# Patient Record
Sex: Male | Born: 1960
Health system: Southern US, Community
[De-identification: ages and names within clinical notes are randomized; demographics above are authoritative.]

## PROBLEM LIST (undated history)

## (undated) DIAGNOSIS — J45909 Unspecified asthma, uncomplicated: Secondary | ICD-10-CM

## (undated) DIAGNOSIS — I519 Heart disease, unspecified: Secondary | ICD-10-CM

## (undated) DIAGNOSIS — M1990 Unspecified osteoarthritis, unspecified site: Secondary | ICD-10-CM

## (undated) DIAGNOSIS — K219 Gastro-esophageal reflux disease without esophagitis: Secondary | ICD-10-CM

## (undated) DIAGNOSIS — K635 Polyp of colon: Secondary | ICD-10-CM

## (undated) DIAGNOSIS — B019 Varicella without complication: Secondary | ICD-10-CM

## (undated) DIAGNOSIS — T7840XA Allergy, unspecified, initial encounter: Secondary | ICD-10-CM

## (undated) DIAGNOSIS — E785 Hyperlipidemia, unspecified: Secondary | ICD-10-CM

## (undated) DIAGNOSIS — I10 Essential (primary) hypertension: Secondary | ICD-10-CM

## (undated) HISTORY — PX: ROTATOR CUFF REPAIR: SHX139

## (undated) HISTORY — PX: COLONOSCOPY: SHX174

## (undated) HISTORY — DX: Gastro-esophageal reflux disease without esophagitis: K21.9

## (undated) HISTORY — DX: Unspecified osteoarthritis, unspecified site: M19.90

## (undated) HISTORY — DX: Unspecified asthma, uncomplicated: J45.909

## (undated) HISTORY — DX: Heart disease, unspecified: I51.9

## (undated) HISTORY — PX: SPINE SURGERY: SHX786

## (undated) HISTORY — DX: Allergy, unspecified, initial encounter: T78.40XA

## (undated) HISTORY — DX: Varicella without complication: B01.9

## (undated) HISTORY — PX: WRIST SURGERY: SHX841

## (undated) HISTORY — DX: Essential (primary) hypertension: I10

## (undated) HISTORY — DX: Polyp of colon: K63.5

## (undated) HISTORY — DX: Hyperlipidemia, unspecified: E78.5

---

## 2006-04-25 ENCOUNTER — Ambulatory Visit (HOSPITAL_COMMUNITY): Admission: RE | Admit: 2006-04-25 | Discharge: 2006-04-26 | Payer: Self-pay | Admitting: Specialist

## 2012-03-23 ENCOUNTER — Other Ambulatory Visit: Payer: Self-pay | Admitting: Family Medicine

## 2012-03-23 ENCOUNTER — Ambulatory Visit
Admission: RE | Admit: 2012-03-23 | Discharge: 2012-03-23 | Disposition: A | Discharge status: Home / Self Care | Payer: 59 | Source: Ambulatory Visit | Attending: Family Medicine | Admitting: Family Medicine

## 2012-03-23 DIAGNOSIS — M5410 Radiculopathy, site unspecified: Secondary | ICD-10-CM

## 2013-11-08 ENCOUNTER — Ambulatory Visit: Payer: 59 | Admitting: Internal Medicine

## 2013-11-22 ENCOUNTER — Encounter: Payer: Self-pay | Admitting: Internal Medicine

## 2013-11-22 ENCOUNTER — Ambulatory Visit (INDEPENDENT_AMBULATORY_CARE_PROVIDER_SITE_OTHER): Payer: 59 | Admitting: Internal Medicine

## 2013-11-22 VITALS — BP 122/84 | HR 64 | Temp 97.9°F | Ht 69.33 in | Wt 198.0 lb

## 2013-11-22 DIAGNOSIS — R1032 Left lower quadrant pain: Secondary | ICD-10-CM

## 2013-11-22 DIAGNOSIS — Z1211 Encounter for screening for malignant neoplasm of colon: Secondary | ICD-10-CM

## 2013-11-22 DIAGNOSIS — R1013 Epigastric pain: Secondary | ICD-10-CM

## 2013-11-22 DIAGNOSIS — R1900 Intra-abdominal and pelvic swelling, mass and lump, unspecified site: Secondary | ICD-10-CM

## 2013-11-22 NOTE — Progress Notes (Signed)
Pre visit review using our clinic review tool, if applicable. No additional management support is needed unless otherwise documented below in the visit note. 

## 2013-11-22 NOTE — Patient Instructions (Addendum)
Colonoscopy  A colonoscopy is an exam to look at the entire large intestine (colon). This exam can help find problems such as tumors, polyps, inflammation, and areas of bleeding. The exam takes about 1 hour.   LET YOUR HEALTH CARE PROVIDER KNOW ABOUT:   · Any allergies you have.  · All medicines you are taking, including vitamins, herbs, eye drops, creams, and over-the-counter medicines.  · Previous problems you or members of your family have had with the use of anesthetics.  · Any blood disorders you have.  · Previous surgeries you have had.  · Medical conditions you have.  RISKS AND COMPLICATIONS   Generally, this is a safe procedure. However, as with any procedure, complications can occur. Possible complications include:  · Bleeding.  · Tearing or rupture of the colon wall.  · Reaction to medicines given during the exam.  · Infection (rare).  BEFORE THE PROCEDURE   · Ask your health care provider about changing or stopping your regular medicines.  · You may be prescribed an oral bowel prep. This involves drinking a large amount of medicated liquid, starting the day before your procedure. The liquid will cause you to have multiple loose stools until your stool is almost clear or light green. This cleans out your colon in preparation for the procedure.  · Do not eat or drink anything else once you have started the bowel prep, unless your health care provider tells you it is safe to do so.  · Arrange for someone to drive you home after the procedure.  PROCEDURE   · You will be given medicine to help you relax (sedative).  · You will lie on your side with your knees bent.  · A long, flexible tube with a light and camera on the end (colonoscope) will be inserted through the rectum and into the colon. The camera sends video back to a computer screen as it moves through the colon. The colonoscope also releases carbon dioxide gas to inflate the colon. This helps your health care provider see the area better.  · During  the exam, your health care provider may take a small tissue sample (biopsy) to be examined under a microscope if any abnormalities are found.  · The exam is finished when the entire colon has been viewed.  AFTER THE PROCEDURE   · Do not drive for 24 hours after the exam.  · You may have a small amount of blood in your stool.  · You may pass moderate amounts of gas and have mild abdominal cramping or bloating. This is caused by the gas used to inflate your colon during the exam.  · Ask when your test results will be ready and how you will get your results. Make sure you get your test results.  Document Released: 04/29/2000 Document Revised: 02/20/2013 Document Reviewed: 01/07/2013  [employer]® Patient Information ©2015 [employer]. This information is not intended to replace advice given to you by your health care provider. Make sure you discuss any questions you have with your health care provider.

## 2013-11-22 NOTE — Progress Notes (Signed)
HPI  Pt presents to the clinic today to establish care. He is transferring care from Dr. Rex Kras. He does have some concerns today about some swelling in his left upper abdomen. He noticed this about 3 weeks ago. He denies nausea, bloating, constipation or blood in his stool. The area is not painful. He denies any trauma to the area. He has not tried anything OTC for it.  Flu: 03/2013 Tetanus: more than 10 years ago Colon Screening: 2014 PSA Screening: never Eye Doctor: yearly Dentist: as needed  Past Medical History  Diagnosis Date  . Allergy-induced asthma   . Arthritis   . Chicken pox   . GERD (gastroesophageal reflux disease)   . Allergy   . Heart disease   . Hypertension   . Hyperlipidemia     Current Outpatient Prescriptions  Medication Sig Dispense Refill  . cetirizine (ZYRTEC) 10 MG tablet Take 10 mg by mouth daily.      Marland Kitchen diltiazem (DILTIAZEM CD) 180 MG 24 hr capsule Take 180 mg by mouth daily.      . famotidine (PEPCID) 20 MG tablet Take 20 mg by mouth 2 (two) times daily.      Marland Kitchen GLUCOSAMINE-CHONDROITIN-MSM-D3 PO Take 2 capsules by mouth daily.      . Omega-3 Fatty Acids (FISH OIL) 1200 MG CAPS Take 2 capsules by mouth daily.       No current facility-administered medications for this visit.    Allergies  Allergen Reactions  . Celebrex [Celecoxib] Swelling  . Penicillins     Was a baby when he had reaction--unknown    Family History  Problem Relation Age of Onset  . Arthritis Mother   . Hyperlipidemia Mother   . Hypertension Mother   . Arthritis Father   . COPD Father   . Hypertension Maternal Uncle   . Arthritis Maternal Grandmother   . Heart disease Maternal Grandmother   . Hypertension Maternal Grandmother   . Arthritis Maternal Grandfather   . Arthritis Paternal Grandmother   . Heart disease Paternal Grandmother   . Arthritis Paternal Grandfather     History   Social History  . Marital Status: Married    Spouse Name: N/A    Number of  Children: N/A  . Years of Education: N/A   Occupational History  . Not on file.   Social History Main Topics  . Smoking status: Former Smoker    Types: Cigarettes  . Smokeless tobacco: Never Used     Comment: quit 10 years  . Alcohol Use: Yes     Comment: occasional  . Drug Use: Not on file  . Sexual Activity: Not on file   Other Topics Concern  . Not on file   Social History Narrative  . No narrative on file    ROS:  Constitutional: Denies fever, malaise, fatigue, headache or abrupt weight changes.  Respiratory: Denies difficulty breathing, shortness of breath, cough or sputum production.   Cardiovascular: Denies chest pain, chest tightness, palpitations or swelling in the hands or feet.  Gastrointestinal: Denies abdominal pain, bloating, constipation, diarrhea or blood in the stool.   No other specific complaints in a complete review of systems (except as listed in HPI above).  PE:  BP 122/84  Pulse 64  Temp(Src) 97.9 F (36.6 C) (Oral)  Ht 5' 9.33" (1.761 m)  Wt 198 lb (89.812 kg)  BMI 28.96 kg/m2  SpO2 98% Wt Readings from Last 3 Encounters:  11/22/13 198 lb (89.812 kg)  General: Appears his stated age, well developed, well nourished in NAD. Cardiovascular: Normal rate and rhythm. S1,S2 noted.  No murmur, rubs or gallops noted. No JVD or BLE edema. No carotid bruits noted. Pulmonary/Chest: Normal effort and positive vesicular breath sounds. No respiratory distress. No wheezes, rales or ronchi noted.  Abdomen: Soft and tender in the epigastric and LLQ. Normal bowel sounds, no bruits noted. No distention or masses noted. Liver, spleen and kidneys non palpable.   Assessment and Plan:  LLQ and epigastric pain:  He is overdue for his screening colonoscopy Will refer to GI to have that done He has a know history of reflux controlled on zantac ? Diverticulosis  Left abdominal swelling:  Appears to be subcutaneous fat to me If does not resolve, will  consider further evaluation Lets take care of the abdominal pain at this time.  RTC as needed or if pain persist or worsen

## 2013-12-05 ENCOUNTER — Encounter: Payer: Self-pay | Admitting: Internal Medicine

## 2014-01-28 ENCOUNTER — Ambulatory Visit (AMBULATORY_SURGERY_CENTER): Payer: Self-pay

## 2014-01-28 VITALS — Ht 70.0 in | Wt 201.2 lb

## 2014-01-28 DIAGNOSIS — Z1211 Encounter for screening for malignant neoplasm of colon: Secondary | ICD-10-CM

## 2014-01-28 MED ORDER — MOVIPREP 100 G PO SOLR: ORAL | Status: DC

## 2014-01-28 NOTE — Progress Notes (Signed)
Per pt, no allergies to soy or egg products.Pt not taking any weight loss meds or using  O2 at home. 

## 2014-02-11 ENCOUNTER — Encounter: Payer: Self-pay | Admitting: Internal Medicine

## 2014-02-11 ENCOUNTER — Ambulatory Visit (AMBULATORY_SURGERY_CENTER): Payer: 59 | Admitting: Internal Medicine

## 2014-02-11 VITALS — BP 105/74 | HR 63 | Temp 97.0°F | Resp 16 | Ht 70.0 in | Wt 201.0 lb

## 2014-02-11 DIAGNOSIS — K635 Polyp of colon: Secondary | ICD-10-CM

## 2014-02-11 DIAGNOSIS — D122 Benign neoplasm of ascending colon: Secondary | ICD-10-CM

## 2014-02-11 DIAGNOSIS — D126 Benign neoplasm of colon, unspecified: Secondary | ICD-10-CM

## 2014-02-11 DIAGNOSIS — Z1211 Encounter for screening for malignant neoplasm of colon: Secondary | ICD-10-CM

## 2014-02-11 HISTORY — DX: Polyp of colon: K63.5

## 2014-02-11 MED ORDER — SODIUM CHLORIDE 0.9 % IV SOLN: 500.0000 mL | INTRAVENOUS | Status: DC

## 2014-02-11 NOTE — Op Note (Signed)
[facility]  [address] [place], [id number]   COLONOSCOPY PROCEDURE REPORT  PATIENT: Daniel Zimmerman, Daniel Zimmerman  MR#: [id number] BIRTHDATE: December 08, 1960 , 53  yrs. old GENDER: male ENDOSCOPIST: Eustace Quail, MD REFERRED JF:[id number] Baity, NP. PROCEDURE DATE:  02/11/2014 PROCEDURE:   Colonoscopy with snare polypectomy x 2 First Screening Colonoscopy - Avg.  risk and is 50 yrs.  old or older Yes.  Prior Negative Screening - Now for repeat screening. N/A  History of Adenoma - Now for follow-up colonoscopy & has been > or = to 3 yrs.  N/A  Polyps Removed Today? Yes. ASA CLASS:   Class II INDICATIONS:first colonoscopy and average risk for colorectal cancer. MEDICATIONS: Monitored anesthesia care and Propofol 250 mg IV  DESCRIPTION OF PROCEDURE:   After the risks benefits and alternatives of the procedure were thoroughly explained, informed consent was obtained.  The digital rectal exam revealed no abnormalities of the rectum.   The LB [id number]  endoscope was introduced through the anus and advanced to the cecum, which was identified by both the appendix and ileocecal valve. No adverse events experienced.   The quality of the prep was excellent, using MoviPrep  The instrument was then slowly withdrawn as the colon was fully examined.  COLON FINDINGS: Two sessile polyps, 5 and 5mm in size, were found in the ascending colon.  A polypectomy was performed with a cold snare.  The resection was complete, the polyp tissue was completely retrieved and sent to histology.   There was moderate diverticulosis noted in the sigmoid colon.   The colon mucosa was otherwise normal.  Retroflexed views revealed no abnormalities. The time to cecum=1 minutes 39 seconds.  Withdrawal time=12 minutes 25 seconds.  The scope was withdrawn and the procedure completed. COMPLICATIONS: There were no immediate complications.  ENDOSCOPIC IMPRESSION: 1.   Two sessile polyps were found in the  ascending colon; polypectomy was performed with a cold snare 2.   Moderate diverticulosis was noted in the sigmoid colon 3.   The colon mucosa was otherwise normal  RECOMMENDATIONS: 1. Follow up colonoscopy in 5 years  eSigned:  Eustace Quail, MD 02/11/2014 1:32 PM   cc: The Patient   ; Webb Silversmith, NP

## 2014-02-11 NOTE — Patient Instructions (Addendum)
YOU HAD AN ENDOSCOPIC PROCEDURE TODAY AT THE [facility]: Refer to the procedure report that was given to you for any specific questions about what was found during the examination.  If the procedure report does not answer your questions, please call your gastroenterologist to clarify.  If you requested that your care partner not be given the details of your procedure findings, then the procedure report has been included in a sealed envelope for you to review at your convenience later.  YOU SHOULD EXPECT: Some feelings of bloating in the abdomen. Passage of more gas than usual.  Walking can help get rid of the air that was put into your GI tract during the procedure and reduce the bloating. If you had a lower endoscopy (such as a colonoscopy or flexible sigmoidoscopy) you may notice spotting of blood in your stool or on the toilet paper. If you underwent a bowel prep for your procedure, then you may not have a normal bowel movement for a few days.  DIET: Your first meal following the procedure should be a light meal and then it is ok to progress to your normal diet.  A half-sandwich or bowl of soup is an example of a good first meal.  Heavy or fried foods are harder to digest and may make you feel nauseous or bloated.  Likewise meals heavy in dairy and vegetables can cause extra gas to form and this can also increase the bloating.  Drink plenty of fluids but you should avoid alcoholic beverages for 24 hours.  ACTIVITY: Your care partner should take you home directly after the procedure.  You should plan to take it easy, moving slowly for the rest of the day.  You can resume normal activity the day after the procedure however you should NOT DRIVE or use heavy machinery for 24 hours (because of the sedation medicines used during the test).    SYMPTOMS TO REPORT IMMEDIATELY: A gastroenterologist can be reached at any hour.  During normal business hours, 8:30 AM to 5:00 PM Monday through Friday,  call [phone].  After hours and on weekends, please call the GI answering service at [phone] who will take a message and have the physician on call contact you.   Following lower endoscopy (colonoscopy or flexible sigmoidoscopy):  Excessive amounts of blood in the stool  Significant tenderness or worsening of abdominal pains  Swelling of the abdomen that is new, acute  Fever of 100F or higher FOLLOW UP: If any biopsies were taken you will be contacted by phone or by letter within the next 1-3 weeks.  Call your gastroenterologist if you have not heard about the biopsies in 3 weeks.  Our staff will call the home number listed on your records the next business day following your procedure to check on you and address any questions or concerns that you may have at that time regarding the information given to you following your procedure. This is a courtesy call and so if there is no answer at the home number and we have not heard from you through the emergency physician on call, we will assume that you have returned to your regular daily activities without incident.  SIGNATURES/CONFIDENTIALITY: You and/or your care partner have signed paperwork which will be entered into your electronic medical record.  These signatures attest to the fact that that the information above on your After Visit Summary has been reviewed and is understood.  Full responsibility of the confidentiality of this discharge   information lies with you and/or your care-partner.     Handouts were given to your care partner on polyps, diverticulosis, a high fiber diet with liberal fluid intake. You may resume your current medications today. Await biopsy results. Please call if any questions or concerns.  YOU HAD AN ENDOSCOPIC PROCEDURE TODAY AT [facility]: Refer to the procedure report that was given to you for any specific questions about what was found during the examination.  If the procedure  report does not answer your questions, please call your gastroenterologist to clarify.  If you requested that your care partner not be given the details of your procedure findings, then the procedure report has been included in a sealed envelope for you to review at your convenience later.  YOU SHOULD EXPECT: Some feelings of bloating in the abdomen. Passage of more gas than usual.  Walking can help get rid of the air that was put into your GI tract during the procedure and reduce the bloating. If you had a lower endoscopy (such as a colonoscopy or flexible sigmoidoscopy) you may notice spotting of blood in your stool or on the toilet paper. If you underwent a bowel prep for your procedure, then you may not have a normal bowel movement for a few days.  DIET: Your first meal following the procedure should be a light meal and then it is ok to progress to your normal diet.  A half-sandwich or bowl of soup is an example of a good first meal.  Heavy or fried foods are harder to digest and may make you feel nauseous or bloated.  Likewise meals heavy in dairy and vegetables can cause extra gas to form and this can also increase the bloating.  Drink plenty of fluids but you should avoid alcoholic beverages for 24 hours.  ACTIVITY: Your care partner should take you home directly after the procedure.  You should plan to take it easy, moving slowly for the rest of the day.  You can resume normal activity the day after the procedure however you should NOT DRIVE or use heavy machinery for 24 hours (because of the sedation medicines used during the test).    SYMPTOMS TO REPORT IMMEDIATELY: A gastroenterologist can be reached at any hour.  During normal business hours, 8:30 AM to 5:00 PM Monday through Friday, call [phone].  After hours and on weekends, please call the GI answering service at [phone] who will take a message and have the physician on call contact you.   Following lower endoscopy  (colonoscopy or flexible sigmoidoscopy):  Excessive amounts of blood in the stool  Significant tenderness or worsening of abdominal pains  Swelling of the abdomen that is new, acute  Fever of 100F or higher  FOLLOW UP: If any biopsies were taken you will be contacted by phone or by letter within the next 1-3 weeks.  Call your gastroenterologist if you have not heard about the biopsies in 3 weeks.  Our staff will call the home number listed on your records the next business day following your procedure to check on you and address any questions or concerns that you may have at that time regarding the information given to you following your procedure. This is a courtesy call and so if there is no answer at the home number and we have not heard from you through the emergency physician on call, we will assume that you have returned to your regular daily activities without incident.  SIGNATURES/CONFIDENTIALITY:  You and/or your care partner have signed paperwork which will be entered into your electronic medical record.  These signatures attest to the fact that that the information above on your After Visit Summary has been reviewed and is understood.  Full responsibility of the confidentiality of this discharge information lies with you and/or your care-partner.  Handouts were given to your care partner on diverticulosis, a high fiber diet with liberal fluid intake, and polyps. Await pathology results. You may resume your current medications today. Please call if any questions or concerns.

## 2014-02-11 NOTE — Progress Notes (Signed)
Report to PACU, RN, vss, BBS= Clear.  

## 2014-02-11 NOTE — Progress Notes (Signed)
Called to room to assist during endoscopic procedure.  Patient ID and intended procedure confirmed with present staff. Received instructions for my participation in the procedure from the performing physician.  

## 2014-02-11 NOTE — Progress Notes (Signed)
No problems noted in the recovery room. maw 

## 2014-02-12 ENCOUNTER — Telehealth: Payer: Self-pay

## 2014-02-12 NOTE — Telephone Encounter (Signed)
  Follow up Call-  Call back number 02/11/2014  Post procedure Call Back phone  # [phone]  Permission to leave phone message Yes     Patient questions:  Do you have a fever, pain , or abdominal swelling? No. Pain Score  0 *  Have you tolerated food without any problems? Yes.    Have you been able to return to your normal activities? Yes.    Do you have any questions about your discharge instructions: Diet   No. Medications  No. Follow up visit  No.  Do you have questions or concerns about your Care? No.  Actions: * If pain score is 4 or above: No action needed, pain <4.

## 2014-02-17 ENCOUNTER — Encounter: Payer: Self-pay | Admitting: Internal Medicine

## 2014-07-25 ENCOUNTER — Telehealth: Payer: Self-pay

## 2014-07-25 NOTE — Telephone Encounter (Signed)
Yes, have him make physical appt

## 2014-07-25 NOTE — Telephone Encounter (Signed)
Mrs Hansson left v/m; pt saw cardiologist recently and cardiologist noted that pt has not had recent lipid panel and requested that pt get that scheduled thru PCP. Thinks might be beneficial for pt to be on cholesterol med to reduce probability of heart attack. Pt was seen 11/22/13 to establish and no future appt scheduled. Does pt need office visit or just lab test?

## 2014-11-08 ENCOUNTER — Encounter: Payer: Self-pay | Admitting: Internal Medicine

## 2014-11-08 ENCOUNTER — Ambulatory Visit (INDEPENDENT_AMBULATORY_CARE_PROVIDER_SITE_OTHER): Payer: 59 | Admitting: Internal Medicine

## 2014-11-08 VITALS — BP 126/76 | Temp 98.0°F | Ht 70.0 in | Wt 204.0 lb

## 2014-11-08 DIAGNOSIS — L237 Allergic contact dermatitis due to plants, except food: Secondary | ICD-10-CM

## 2014-11-08 MED ORDER — METHYLPREDNISOLONE ACETATE 80 MG/ML IJ SUSP
120.0000 mg | Freq: Once | INTRAMUSCULAR | Status: AC
  Administered 2014-11-08: 120 mg via INTRAMUSCULAR

## 2014-11-08 MED ORDER — PREDNISONE 10 MG PO TABS: ORAL_TABLET | ORAL | Status: DC

## 2014-11-08 NOTE — Patient Instructions (Signed)
I agree with is contact dermatitis . Like poison ivy . Prednisone  taper  As directed   Avoidance measures as discussed .  Poison [employer] ivy is a inflammation of the skin (contact dermatitis) caused by touching the allergens on the leaves of the ivy plant following previous exposure to the plant. The rash usually appears 48 hours after exposure. The rash is usually bumps (papules) or blisters (vesicles) in a linear pattern. Depending on your own sensitivity, the rash may simply cause redness and itching, or it may also progress to blisters which may break open. These must be well cared for to prevent secondary bacterial (germ) infection, followed by scarring. Keep any open areas dry, clean, dressed, and covered with an antibacterial ointment if needed. The eyes may also get puffy. The puffiness is worst in the morning and gets better as the day progresses. This dermatitis usually heals without scarring, within 2 to 3 weeks without treatment. HOME CARE INSTRUCTIONS  Thoroughly wash with soap and water as soon as you have been exposed to poison ivy. You have about one half hour to remove the plant resin before it will cause the rash. This washing will destroy the oil or antigen on the skin that is causing, or will cause, the rash. Be sure to wash under your fingernails as any plant resin there will continue to spread the rash. Do not rub skin vigorously when washing affected area. Poison ivy cannot spread if no oil from the plant remains on your body. A rash that has progressed to weeping sores will not spread the rash unless you have not washed thoroughly. It is also important to wash any clothes you have been wearing as these may carry active allergens. The rash will return if you wear the unwashed clothing, even several days later. Avoidance of the plant in the future is the best measure. Poison ivy plant can be recognized by the number of leaves. Generally, poison ivy has three leaves with flowering  branches on a single stem. Diphenhydramine may be purchased over the counter and used as needed for itching. Do not drive with this medication if it makes you drowsy.Ask your caregiver about medication for children. SEEK MEDICAL CARE IF:  Open sores develop.  Redness spreads beyond area of rash.  You notice purulent (pus-like) discharge.  You have increased pain.  Other signs of infection develop (such as fever). Document Released: 04/29/2000 Document Revised: 07/25/2011 Document Reviewed: 10/10/2008 [facility] Patient Information 2015 [place], [place]. This information is not intended to replace advice given to you by your health care provider. Make sure you discuss any questions you have with your health care provider.

## 2014-11-08 NOTE — Progress Notes (Signed)
Pre visit review using our clinic review tool, if applicable. No additional management support is needed unless otherwise documented below in the visit note.   Chief Complaint  Patient presents with  . Poison Ivy    HPI: Patient comes in today for [facility] Saturday clinic for  new problem evaluation. Onset itchy rash arms and now  Trunk spreading neck and eyes burning ? On face  . Without fever sytemic sx  No  Contact  lttle dog onset rash since 2 days ago.   Gardening 3-4 weeks ago avoid pi  donest know wher could have gotten this and has ?s about spread and rx  Remote hx of same years ago. ROS: See pertinent positives and negatives per HPI. No fever   Past Medical History  Diagnosis Date  . Allergy-induced asthma     in past  . Arthritis   . Chicken pox   . GERD (gastroesophageal reflux disease)   . Allergy   . Heart disease   . Hypertension   . Hyperlipidemia     Family History  Problem Relation Age of Onset  . Arthritis Mother   . Hyperlipidemia Mother   . Hypertension Mother   . Arthritis Father   . COPD Father   . Hypertension Maternal Uncle   . Arthritis Maternal Grandmother   . Heart disease Maternal Grandmother   . Hypertension Maternal Grandmother   . Arthritis Maternal Grandfather   . Arthritis Paternal Grandmother   . Heart disease Paternal Grandmother   . Arthritis Paternal Grandfather     History   Social History  . Marital Status: Married    Spouse Name: N/A  . Number of Children: N/A  . Years of Education: N/A   Social History Main Topics  . Smoking status: Former Smoker    Types: Cigarettes    Quit date: 10/03/2003  . Smokeless tobacco: Never Used     Comment: quit 10 years  . Alcohol Use: No     Comment: occasional  . Drug Use: No  . Sexual Activity: Yes   Other Topics Concern  . None   Social History Narrative    Outpatient Prescriptions Prior to Visit  Medication Sig Dispense Refill  . cetirizine (ZYRTEC) 10 MG tablet Take 10 mg by  mouth daily.    Marland Kitchen diltiazem (DILTIAZEM CD) 180 MG 24 hr capsule Take 180 mg by mouth daily.    . famotidine (PEPCID) 20 MG tablet Take 20 mg by mouth 2 (two) times daily.    Marland Kitchen GLUCOSAMINE-CHONDROITIN-MSM-D3 PO Take 2 capsules by mouth daily.    Marland Kitchen losartan (COZAAR) 50 MG tablet Take 50 mg by mouth daily.    . nitroGLYCERIN (NITROSTAT) 0.3 MG SL tablet Place 0.3 mg under the tongue every 5 (five) minutes as needed for chest pain.    . Omega-3 Fatty Acids (FISH OIL) 1200 MG CAPS Take 2 capsules by mouth daily.     No facility-administered medications prior to visit.     EXAM:  BP 126/76 mmHg  Temp(Src) 98 F (36.7 C) (Oral)  Ht 5\' 10"  (1.778 m)  Wt 204 lb (92.534 kg)  BMI 29.27 kg/m2  Body mass index is 29.27 kg/(m^2).  GENERAL: vitals reviewed and listed above, alert, oriented, appears well hydrated and in no acute distress HEENT: atraumatic, conjunctiva  clear, no obvious abnormalities on inspection of external nose and ears   Some eryema around eye brows no edema OP : no lesion edema or exudate  NECK: no  obvious masses on inspection palpation  Skin: normal capillary refill ,turgor , color cd derm rasha ans oemlinear erythema forearms back trunk neck  And erythema o face  no ,petechiae or bruising MS: moves all extremities without noticeable focal  abnormality PSYCH: pleasant and cooperative, no obvious depression or anxiety  ASSESSMENT AND PLAN:  Discussed the following assessment and plan:  Poison ivy - Plan: methylPREDNISolone acetate (DEPO-MEDROL) injection 120 mg invlolving arms  Trunk and spreading to face  Disc counseled  Total visit 35mins > 50% spent counseling and coordinating care as indicated in above note and in instructions to patient .   uncertain where he was epxosed   No need to do extra laundry past the initial insult.  rx steroid inj and orals  . Se discussed pt aware -Patient advised to return or notify health care team  if symptoms worsen ,persist or new  concerns arise.  Patient Instructions  I agree with is contact dermatitis . Like poison ivy . Prednisone  taper  As directed   Avoidance measures as discussed .  Poison [employer] ivy is a inflammation of the skin (contact dermatitis) caused by touching the allergens on the leaves of the ivy plant following previous exposure to the plant. The rash usually appears 48 hours after exposure. The rash is usually bumps (papules) or blisters (vesicles) in a linear pattern. Depending on your own sensitivity, the rash may simply cause redness and itching, or it may also progress to blisters which may break open. These must be well cared for to prevent secondary bacterial (germ) infection, followed by scarring. Keep any open areas dry, clean, dressed, and covered with an antibacterial ointment if needed. The eyes may also get puffy. The puffiness is worst in the morning and gets better as the day progresses. This dermatitis usually heals without scarring, within 2 to 3 weeks without treatment. HOME CARE INSTRUCTIONS  Thoroughly wash with soap and water as soon as you have been exposed to poison ivy. You have about one half hour to remove the plant resin before it will cause the rash. This washing will destroy the oil or antigen on the skin that is causing, or will cause, the rash. Be sure to wash under your fingernails as any plant resin there will continue to spread the rash. Do not rub skin vigorously when washing affected area. Poison ivy cannot spread if no oil from the plant remains on your body. A rash that has progressed to weeping sores will not spread the rash unless you have not washed thoroughly. It is also important to wash any clothes you have been wearing as these may carry active allergens. The rash will return if you wear the unwashed clothing, even several days later. Avoidance of the plant in the future is the best measure. Poison ivy plant can be recognized by the number of leaves. Generally,  poison ivy has three leaves with flowering branches on a single stem. Diphenhydramine may be purchased over the counter and used as needed for itching. Do not drive with this medication if it makes you drowsy.Ask your caregiver about medication for children. SEEK MEDICAL CARE IF:  Open sores develop.  Redness spreads beyond area of rash.  You notice purulent (pus-like) discharge.  You have increased pain.  Other signs of infection develop (such as fever). Document Released: 04/29/2000 Document Revised: 07/25/2011 Document Reviewed: 10/10/2008 [facility] Patient Information 2015 [place], [place]. This information is not intended to replace advice given to you by your  health care provider. Make sure you discuss any questions you have with your health care provider.      Standley Brooking. Talal Fritchman M.D.

## 2014-11-24 ENCOUNTER — Ambulatory Visit (INDEPENDENT_AMBULATORY_CARE_PROVIDER_SITE_OTHER): Payer: 59 | Admitting: Internal Medicine

## 2014-11-24 ENCOUNTER — Encounter: Payer: Self-pay | Admitting: Internal Medicine

## 2014-11-24 VITALS — BP 118/78 | HR 84 | Temp 98.4°F | Wt 204.0 lb

## 2014-11-24 DIAGNOSIS — R3915 Urgency of urination: Secondary | ICD-10-CM

## 2014-11-24 DIAGNOSIS — R51 Headache: Secondary | ICD-10-CM

## 2014-11-24 DIAGNOSIS — R519 Headache, unspecified: Secondary | ICD-10-CM

## 2014-11-24 DIAGNOSIS — K6289 Other specified diseases of anus and rectum: Secondary | ICD-10-CM | POA: Diagnosis not present

## 2014-11-24 DIAGNOSIS — R591 Generalized enlarged lymph nodes: Secondary | ICD-10-CM | POA: Diagnosis not present

## 2014-11-24 DIAGNOSIS — R3989 Other symptoms and signs involving the genitourinary system: Secondary | ICD-10-CM

## 2014-11-24 DIAGNOSIS — R1032 Left lower quadrant pain: Secondary | ICD-10-CM | POA: Diagnosis not present

## 2014-11-24 DIAGNOSIS — N3289 Other specified disorders of bladder: Secondary | ICD-10-CM | POA: Diagnosis not present

## 2014-11-24 DIAGNOSIS — R599 Enlarged lymph nodes, unspecified: Secondary | ICD-10-CM

## 2014-11-24 LAB — CBC WITH DIFFERENTIAL/PLATELET
BASOS ABS: 0 10*3/uL (ref 0.0–0.1)
Basophils Relative: 0.2 % (ref 0.0–3.0)
EOS ABS: 0.2 10*3/uL (ref 0.0–0.7)
Eosinophils Relative: 2.1 % (ref 0.0–5.0)
HEMATOCRIT: 48.7 % (ref 39.0–52.0)
HEMOGLOBIN: 16.3 g/dL (ref 13.0–17.0)
Lymphocytes Relative: 17.3 % (ref 12.0–46.0)
Lymphs Abs: 1.6 10*3/uL (ref 0.7–4.0)
MCHC: 33.5 g/dL (ref 30.0–36.0)
MCV: 90.8 fl (ref 78.0–100.0)
MONO ABS: 0.8 10*3/uL (ref 0.1–1.0)
Monocytes Relative: 9.2 % (ref 3.0–12.0)
NEUTROS ABS: 6.5 10*3/uL (ref 1.4–7.7)
Neutrophils Relative %: 71.2 % (ref 43.0–77.0)
Platelets: 208 10*3/uL (ref 150.0–400.0)
RBC: 5.37 Mil/uL (ref 4.22–5.81)
RDW: 13.7 % (ref 11.5–15.5)
WBC: 9.2 10*3/uL (ref 4.0–10.5)

## 2014-11-24 LAB — PSA: PSA: 0.9 ng/mL (ref 0.10–4.00)

## 2014-11-24 NOTE — Patient Instructions (Signed)

## 2014-11-24 NOTE — Progress Notes (Signed)
Pre visit review using our clinic review tool, if applicable. No additional management support is needed unless otherwise documented below in the visit note. 

## 2014-11-24 NOTE — Progress Notes (Signed)
Subjective:    Patient ID: Daniel Zimmerman, male    DOB: [birth year], 54 y.o.   MRN: [id number]  HPI  Pt reports headache and swollen glands. He reports this started 2-3 days ago. He denies sore throat or difficulty swallowing. The headache is located in his temples.   He describes the pain as pressure. He denies dizziness or blurred vision. He feels like he "can hear his heartbeat in his throat". He also reports some urinary urgency and low back pain.  He denies dysuria or low back pain. He denies fever, chills or body aches. He does have a history of seasonal allergies but he reports this feels different. He reports no on has ever told him that he has an enlarged prostate or kidney stones. He is having normal BM's and denies blood in his stool.   Review of Systems      Past Medical History  Diagnosis Date  . Allergy-induced asthma     in past  . Arthritis   . Chicken pox   . GERD (gastroesophageal reflux disease)   . Allergy   . Heart disease   . Hypertension   . Hyperlipidemia     Current Outpatient Prescriptions  Medication Sig Dispense Refill  . atorvastatin (LIPITOR) 20 MG tablet Take 20 mg by mouth daily.  3  . cetirizine (ZYRTEC) 10 MG tablet Take 10 mg by mouth daily.    Marland Kitchen diltiazem (DILTIAZEM CD) 180 MG 24 hr capsule Take 180 mg by mouth daily.    . famotidine (PEPCID) 20 MG tablet Take 20 mg by mouth 2 (two) times daily.    Marland Kitchen GLUCOSAMINE-CHONDROITIN-MSM-D3 PO Take 2 capsules by mouth daily.    Marland Kitchen losartan (COZAAR) 50 MG tablet Take 50 mg by mouth daily.    . nitroGLYCERIN (NITROSTAT) 0.3 MG SL tablet Place 0.3 mg under the tongue every 5 (five) minutes as needed for chest pain.    . Omega-3 Fatty Acids (FISH OIL) 1200 MG CAPS Take 2 capsules by mouth daily.     No current facility-administered medications for this visit.    Allergies  Allergen Reactions  . Celebrex [Celecoxib] Swelling  . Penicillins     Was a baby when he had reaction--unknown    Family  History  Problem Relation Age of Onset  . Arthritis Mother   . Hyperlipidemia Mother   . Hypertension Mother   . Arthritis Father   . COPD Father   . Hypertension Maternal Uncle   . Arthritis Maternal Grandmother   . Heart disease Maternal Grandmother   . Hypertension Maternal Grandmother   . Arthritis Maternal Grandfather   . Arthritis Paternal Grandmother   . Heart disease Paternal Grandmother   . Arthritis Paternal Grandfather     History   Social History  . Marital Status: Married    Spouse Name: N/A  . Number of Children: N/A  . Years of Education: N/A   Occupational History  . Not on file.   Social History Main Topics  . Smoking status: Former Smoker    Types: Cigarettes    Quit date: 10/03/2003  . Smokeless tobacco: Never Used     Comment: quit 10 years  . Alcohol Use: No     Comment: occasional  . Drug Use: No  . Sexual Activity: Yes   Other Topics Concern  . Not on file   Social History Narrative     Constitutional: Pt reports headache. Denies fever, malaise, fatigue, headache or abrupt  weight changes.  HEENT: Pt reports swollen glands. Denies eye pain, eye redness, ear pain, ringing in the ears, wax buildup, runny nose, nasal congestion, bloody nose, or sore throat. Respiratory: Denies difficulty breathing, shortness of breath, cough or sputum production.   Cardiovascular: Denies chest pain, chest tightness, palpitations or swelling in the hands or feet.  Gastrointestinal: Denies abdominal pain, bloating, constipation, diarrhea or blood in the stool.  GU: Pt reports urinary urgency. Denies frequency, pain with urination, burning sensation, blood in urine, odor or discharge. Musculoskeletal: Pt reports low back pain. Denies decrease in range of motion, difficulty with gait, muscle pain or joint pain and swelling.  Skin: Denies redness, rashes, lesions or ulcercations.  Neurological: Denies dizziness, difficulty with memory, difficulty with speech or  problems with balance and coordination.   No other specific complaints in a complete review of systems (except as listed in HPI above).  Objective:   Physical Exam   BP 118/78 mmHg  Pulse 84  Temp(Src) 98.4 F (36.9 C) (Oral)  Wt 204 lb (92.534 kg)  SpO2 97% Wt Readings from Last 3 Encounters:  11/24/14 204 lb (92.534 kg)  11/08/14 204 lb (92.534 kg)  02/11/14 201 lb (91.173 kg)    General: Appears his stated age, well developed, well nourished in NAD. HEENT: Head: normal shape and size, no sinus tenderness noted; Eyes: sclera white, no icterus, conjunctiva pink, PERRLA and EOMs intact; Ears: Tm's gray and intact, normal light reflex; Throat/Mouth: Teeth present, mucosa pink and moist, no exudate, lesions or ulcerations noted.  Neck: No adenopathy noted. Cardiovascular: Normal rate and rhythm. S1,S2 noted.  No murmur, rubs or gallops noted.  Pulmonary/Chest: Normal effort and positive vesicular breath sounds. No respiratory distress. No wheezes, rales or ronchi noted.  Abdomen: Soft and tender over the bladder and in the LLQ. Normal bowel sounds, no bruits noted. No distention or masses noted. no CVA tenderness noted. Rectal: He screamed in pain with insertion of the fingertip. I was unable to proceed any further secondary to pain. Musculoskeletal: Normal flexion, extension and rotation of the spine. No difficulty with gait.  Neurological: Alert and oriented.        Assessment & Plan:   Headache, swollen glands, abdominal pain, bladder pressure, urinary urgency and rectal pain:  Urinalysis: normal No s/s of infection on exam Was unable to check prostate or stool for blood due to extreme pain Will check CBC with diff and PSA today Ibuprofen as needed for now May need to follow up with CT abd/pelvis depending on the labs  Will follow up after labs are back, RTC as needed or if symptoms persist or worsen

## 2014-11-28 LAB — POCT URINALYSIS DIPSTICK
BILIRUBIN UA: NEGATIVE
GLUCOSE UA: NEGATIVE
KETONES UA: NEGATIVE
LEUKOCYTES UA: NEGATIVE
NITRITE UA: NEGATIVE
PROTEIN UA: NEGATIVE
RBC UA: NEGATIVE
Spec Grav, UA: 1.02
Urobilinogen, UA: NEGATIVE
pH, UA: 6

## 2014-11-28 NOTE — Addendum Note (Signed)
Addended by: Lurlean Nanny on: 11/28/2014 08:34 AM   Modules accepted: Orders

## 2015-05-06 ENCOUNTER — Encounter: Payer: Self-pay | Admitting: Internal Medicine

## 2015-05-06 ENCOUNTER — Ambulatory Visit (INDEPENDENT_AMBULATORY_CARE_PROVIDER_SITE_OTHER): Payer: 59 | Admitting: Internal Medicine

## 2015-05-06 VITALS — BP 118/80 | HR 67 | Temp 98.3°F | Wt 197.0 lb

## 2015-05-06 DIAGNOSIS — K921 Melena: Secondary | ICD-10-CM | POA: Diagnosis not present

## 2015-05-06 DIAGNOSIS — R1013 Epigastric pain: Secondary | ICD-10-CM

## 2015-05-06 DIAGNOSIS — R1012 Left upper quadrant pain: Secondary | ICD-10-CM

## 2015-05-06 LAB — COMPREHENSIVE METABOLIC PANEL
ALK PHOS: 87 U/L (ref 39–117)
ALT: 57 U/L — AB (ref 0–53)
AST: 29 U/L (ref 0–37)
Albumin: 4.5 g/dL (ref 3.5–5.2)
BILIRUBIN TOTAL: 0.7 mg/dL (ref 0.2–1.2)
BUN: 11 mg/dL (ref 6–23)
CALCIUM: 9.5 mg/dL (ref 8.4–10.5)
CO2: 28 meq/L (ref 19–32)
Chloride: 102 mEq/L (ref 96–112)
Creatinine, Ser: 0.93 mg/dL (ref 0.40–1.50)
GFR: 90.23 mL/min (ref >60.00)
Glucose, Bld: 89 mg/dL (ref 70–99)
POTASSIUM: 4.2 meq/L (ref 3.5–5.1)
Sodium: 139 mEq/L (ref 135–145)
Total Protein: 7.4 g/dL (ref 6.0–8.3)

## 2015-05-06 LAB — LIPASE: Lipase: 22 U/L (ref 11.0–59.0)

## 2015-05-06 LAB — CBC
HEMATOCRIT: 47.9 % (ref 39.0–52.0)
HEMOGLOBIN: 15.9 g/dL (ref 13.0–17.0)
MCHC: 33.3 g/dL (ref 30.0–36.0)
MCV: 89.8 fl (ref 78.0–100.0)
PLATELETS: 235 10*3/uL (ref 150.0–400.0)
RBC: 5.33 Mil/uL (ref 4.22–5.81)
RDW: 13.7 % (ref 11.5–15.5)
WBC: 8.1 10*3/uL (ref 4.0–10.5)

## 2015-05-06 MED ORDER — HYDROCODONE-ACETAMINOPHEN 5-325 MG PO TABS: 1.0000 | ORAL_TABLET | Freq: Four times a day (QID) | ORAL | Status: DC | PRN

## 2015-05-06 NOTE — Patient Instructions (Signed)

## 2015-05-06 NOTE — Progress Notes (Signed)
Pre visit review using our clinic review tool, if applicable. No additional management support is needed unless otherwise documented below in the visit note. 

## 2015-05-06 NOTE — Progress Notes (Signed)
Subjective:    Patient ID: Daniel Zimmerman, male    DOB: [birth year], 54 y.o.   MRN: [id number]  HPI  Pt presents to the clinic today with c/o LUQ abdominal pain and blood in his stool. The abdominal pain started 2-3 weeks ago. SHe describes the pain as sharp and stabbing. The pain radiates into his left back. He reports the pain has increased in severity and his lasting longer than normal. The pain seems worse with taking a deep breath but he denies shortness of breath. He denies chest pain or reflux. He does have some nausea but no vomiting. He noticed the blood in his stool yesterday, twice. It was bright red. He denies rectal pain. He has no history of hemorrhoids. He did have a colonoscopy 01/2014, which did show diverticulosis. He had 2 sessile polyps removed at that time. He denies recent falls, abdominal trauma, alcohol use, high triglycerides or frequent NSAID use. He has not tried anything OTC.  Review of Systems      Past Medical History  Diagnosis Date  . Allergy-induced asthma     in past  . Arthritis   . Chicken pox   . GERD (gastroesophageal reflux disease)   . Allergy   . Heart disease   . Hypertension   . Hyperlipidemia     Current Outpatient Prescriptions  Medication Sig Dispense Refill  . atorvastatin (LIPITOR) 20 MG tablet Take 20 mg by mouth daily.  3  . cetirizine (ZYRTEC) 10 MG tablet Take 10 mg by mouth daily.    Marland Kitchen diltiazem (DILTIAZEM CD) 180 MG 24 hr capsule Take 180 mg by mouth daily.    . famotidine (PEPCID) 20 MG tablet Take 20 mg by mouth 2 (two) times daily.    Marland Kitchen GLUCOSAMINE-CHONDROITIN-MSM-D3 PO Take 2 capsules by mouth daily.    Marland Kitchen losartan (COZAAR) 50 MG tablet Take 50 mg by mouth daily.    . nitroGLYCERIN (NITROSTAT) 0.3 MG SL tablet Place 0.3 mg under the tongue every 5 (five) minutes as needed for chest pain.    . Omega-3 Fatty Acids (FISH OIL) 1200 MG CAPS Take 2 capsules by mouth daily.     No current facility-administered medications for this  visit.    Allergies  Allergen Reactions  . Celebrex [Celecoxib] Swelling  . Penicillins     Was a baby when he had reaction--unknown    Family History  Problem Relation Age of Onset  . Arthritis Mother   . Hyperlipidemia Mother   . Hypertension Mother   . Arthritis Father   . COPD Father   . Hypertension Maternal Uncle   . Arthritis Maternal Grandmother   . Heart disease Maternal Grandmother   . Hypertension Maternal Grandmother   . Arthritis Maternal Grandfather   . Arthritis Paternal Grandmother   . Heart disease Paternal Grandmother   . Arthritis Paternal Grandfather     Social History   Social History  . Marital Status: Married    Spouse Name: N/A  . Number of Children: N/A  . Years of Education: N/A   Occupational History  . Not on file.   Social History Main Topics  . Smoking status: Former Smoker    Types: Cigarettes    Quit date: 10/03/2003  . Smokeless tobacco: Never Used     Comment: quit 10 years  . Alcohol Use: No     Comment: occasional  . Drug Use: No  . Sexual Activity: Yes   Other Topics Concern  .  Not on file   Social History Narrative     Constitutional: Pt reports 10 lb weight loss in 3 weeks and fatigue. Denies fever, malaise, headache.  Respiratory: Denies difficulty breathing, shortness of breath, cough or sputum production.   Cardiovascular: Denies chest pain, chest tightness, palpitations or swelling in the hands or feet.  Gastrointestinal: Pt reports abdominal pain and blood in his stool. Denies bloating, constipation, diarrhea.  GU: Denies urgency, frequency, pain with urination, burning sensation, blood in urine, odor or discharge.  No other specific complaints in a complete review of systems (except as listed in HPI above).  Objective:   Physical Exam    BP 118/80 mmHg  Pulse 67  Temp(Src) 98.3 F (36.8 C) (Oral)  Wt 197 lb (89.359 kg)  SpO2 97% Wt Readings from Last 3 Encounters:  05/06/15 197 lb (89.359 kg)    11/24/14 204 lb (92.534 kg)  11/08/14 204 lb (92.534 kg)    General: Appears his stated age, in NAD. Cardiovascular: Normal rate and rhythm. S1,S2 noted.  No murmur, rubs or gallops noted.  Pulmonary/Chest: Normal effort and positive vesicular breath sounds. No respiratory distress. No wheezes, rales or ronchi noted.  Abdomen: Distended and tender in the epigastric area and LUQ. Normal bowel sounds. No masses noted. No CVA tenderness. Rectal: No external hemorrhoid noted. Normal rectal tone. No internal mass noted. Stool hemoccult positive.  BMET No results found for: NA, K, CL, CO2, GLUCOSE, BUN, CREATININE, CALCIUM, GFRNONAA, GFRAA  Lipid Panel  No results found for: CHOL, TRIG, HDL, CHOLHDL, VLDL, LDLCALC  CBC    Component Value Date/Time   WBC 9.2 11/24/2014 1100   RBC 5.37 11/24/2014 1100   HGB 16.3 11/24/2014 1100   HCT 48.7 11/24/2014 1100   PLT 208.0 11/24/2014 1100   MCV 90.8 11/24/2014 1100   MCHC 33.5 11/24/2014 1100   RDW 13.7 11/24/2014 1100   LYMPHSABS 1.6 11/24/2014 1100   MONOABS 0.8 11/24/2014 1100   EOSABS 0.2 11/24/2014 1100   BASOSABS 0.0 11/24/2014 1100    Hgb A1C No results found for: HGBA1C     Assessment & Plan:   Fatigue, epigastric, LUQ abdominal pain and blood in his stool:  Will check mono, Lipase, H Pylori, CBC and CMET RX for Norco for pain If pain/bleeding persist, consider CT abdomen versus GI referral  Will follow up after labs, make an appt for an annual exam

## 2015-05-07 ENCOUNTER — Telehealth: Payer: Self-pay | Admitting: Internal Medicine

## 2015-05-07 LAB — MONONUCLEOSIS SCREEN: MONO SCREEN: NEGATIVE

## 2015-05-07 NOTE — Telephone Encounter (Signed)
Spouse called to get lab results

## 2015-05-08 ENCOUNTER — Ambulatory Visit
Admission: RE | Admit: 2015-05-08 | Discharge: 2015-05-08 | Disposition: A | Discharge status: Home / Self Care | Payer: 59 | Source: Ambulatory Visit | Attending: Internal Medicine | Admitting: Internal Medicine

## 2015-05-08 ENCOUNTER — Other Ambulatory Visit: Payer: Self-pay | Admitting: Internal Medicine

## 2015-05-08 ENCOUNTER — Telehealth: Payer: Self-pay | Admitting: Internal Medicine

## 2015-05-08 DIAGNOSIS — R1012 Left upper quadrant pain: Secondary | ICD-10-CM

## 2015-05-08 DIAGNOSIS — R1031 Right lower quadrant pain: Secondary | ICD-10-CM

## 2015-05-08 DIAGNOSIS — K921 Melena: Secondary | ICD-10-CM

## 2015-05-08 MED ORDER — IOPAMIDOL (ISOVUE-300) INJECTION 61%
100.0000 mL | Freq: Once | INTRAVENOUS | Status: AC | PRN
  Administered 2015-05-08: 100 mL via INTRAVENOUS

## 2015-05-08 MED ORDER — IOHEXOL 300 MG/ML  SOLN
30.0000 mL | Freq: Once | INTRAMUSCULAR | Status: AC | PRN
  Administered 2015-05-08: 30 mL via ORAL

## 2015-05-08 NOTE — Telephone Encounter (Signed)
Patient's wife called back and said she spoke to the insurance.  She wants her husband to go to [facility] for CT.  The insurance said in order to get CT done an urgent prior authorization would have to be called to [phone].  If it's not put in urgent, then it would be 40 hours to 15 days before CT could be done. Patient wants to know if he should call his Cardiologist, since there's nothing showing on the lab work.

## 2015-05-08 NOTE — Telephone Encounter (Signed)
CT ordered. 

## 2015-05-08 NOTE — Telephone Encounter (Signed)
Pt's spouse called and pt is continuing to have pain so sharp that he is unable to take a deep breath when he has the pains.  The HYDROcodone-acetaminophen (NORCO/VICODIN) 5-325 is helping him to sleep, however they would like to get a CT today as soon as it can be scheduled.  Pt prefers [place] if possible.  Spouse is going to check out their insurance plan for the best location to get the CT.  Also spouse would like to know if pt should be seen by a gastroenterologist?  Best number to call is [phone]

## 2015-05-15 NOTE — Addendum Note (Signed)
Addended by: Ellamae Sia on: 05/15/2015 10:50 AM   Modules accepted: Orders

## 2015-05-29 ENCOUNTER — Encounter: Payer: Self-pay | Admitting: *Deleted

## 2015-06-01 ENCOUNTER — Encounter: Payer: Self-pay | Admitting: Physician Assistant

## 2015-06-01 ENCOUNTER — Ambulatory Visit (INDEPENDENT_AMBULATORY_CARE_PROVIDER_SITE_OTHER): Payer: 59 | Admitting: Physician Assistant

## 2015-06-01 ENCOUNTER — Other Ambulatory Visit (INDEPENDENT_AMBULATORY_CARE_PROVIDER_SITE_OTHER): Payer: 59

## 2015-06-01 VITALS — BP 118/82 | HR 64 | Ht 71.0 in | Wt 199.4 lb

## 2015-06-01 DIAGNOSIS — Z86010 Personal history of colonic polyps: Secondary | ICD-10-CM | POA: Insufficient documentation

## 2015-06-01 DIAGNOSIS — K625 Hemorrhage of anus and rectum: Secondary | ICD-10-CM

## 2015-06-01 DIAGNOSIS — R935 Abnormal findings on diagnostic imaging of other abdominal regions, including retroperitoneum: Secondary | ICD-10-CM

## 2015-06-01 DIAGNOSIS — R634 Abnormal weight loss: Secondary | ICD-10-CM

## 2015-06-01 DIAGNOSIS — K5730 Diverticulosis of large intestine without perforation or abscess without bleeding: Secondary | ICD-10-CM

## 2015-06-01 DIAGNOSIS — I201 Angina pectoris with documented spasm: Secondary | ICD-10-CM

## 2015-06-01 DIAGNOSIS — R1012 Left upper quadrant pain: Secondary | ICD-10-CM

## 2015-06-01 DIAGNOSIS — I10 Essential (primary) hypertension: Secondary | ICD-10-CM | POA: Insufficient documentation

## 2015-06-01 LAB — CBC WITH DIFFERENTIAL/PLATELET
BASOS PCT: 0.7 % (ref 0.0–3.0)
Basophils Absolute: 0.1 10*3/uL (ref 0.0–0.1)
EOS ABS: 0.4 10*3/uL (ref 0.0–0.7)
Eosinophils Relative: 5.8 % — ABNORMAL HIGH (ref 0.0–5.0)
HEMATOCRIT: 48.8 % (ref 39.0–52.0)
HEMOGLOBIN: 16.3 g/dL (ref 13.0–17.0)
LYMPHS PCT: 23.8 % (ref 12.0–46.0)
Lymphs Abs: 1.8 10*3/uL (ref 0.7–4.0)
MCHC: 33.5 g/dL (ref 30.0–36.0)
MCV: 89 fl (ref 78.0–100.0)
MONOS PCT: 9.1 % (ref 3.0–12.0)
Monocytes Absolute: 0.7 10*3/uL (ref 0.1–1.0)
Neutro Abs: 4.7 10*3/uL (ref 1.4–7.7)
Neutrophils Relative %: 60.6 % (ref 43.0–77.0)
Platelets: 235 10*3/uL (ref 150.0–400.0)
RBC: 5.48 Mil/uL (ref 4.22–5.81)
RDW: 13.6 % (ref 11.5–15.5)
WBC: 7.8 10*3/uL (ref 4.0–10.5)

## 2015-06-01 LAB — COMPREHENSIVE METABOLIC PANEL
ALBUMIN: 4.3 g/dL (ref 3.5–5.2)
ALT: 44 U/L (ref 0–53)
AST: 21 U/L (ref 0–37)
Alkaline Phosphatase: 82 U/L (ref 39–117)
BILIRUBIN TOTAL: 0.6 mg/dL (ref 0.2–1.2)
BUN: 11 mg/dL (ref 6–23)
CALCIUM: 8.9 mg/dL (ref 8.4–10.5)
CHLORIDE: 107 meq/L (ref 96–112)
CO2: 22 mEq/L (ref 19–32)
CREATININE: 0.8 mg/dL (ref 0.40–1.50)
GFR: 107.32 mL/min (ref >60.00)
Glucose, Bld: 89 mg/dL (ref 70–99)
Potassium: 4.4 mEq/L (ref 3.5–5.1)
Sodium: 141 mEq/L (ref 135–145)
Total Protein: 7.3 g/dL (ref 6.0–8.3)

## 2015-06-01 MED ORDER — NA SULFATE-K SULFATE-MG SULF 17.5-3.13-1.6 GM/177ML PO SOLN: 1.0000 | Freq: Once | ORAL | Status: DC

## 2015-06-01 NOTE — Progress Notes (Signed)
Patient ID: Daniel Zimmerman, male   DOB: 23-Jan-1961, 55 y.o.   MRN: [id number]   Subjective:    Patient ID: Daniel Zimmerman, male    DOB: 06-19-1960, 55 y.o.   MRN: [id number]  HPI  Daniel Zimmerman is a pleasant 55 year old white male known to Dr. Scarlette Shorts. He is referred today by Cecille Po NP for evaluation of recent episodes of abdominal pain and rectal bleeding. Patient had undergone colonoscopy in September 2015 with finding of 2 polyps the largest 9 mm were removed and found to be sessile serrated polyps. He was also noted to have moderate sigmoid diverticulosis. Patient says he has had his current symptoms over the past 1 month. He says symptoms started with 2-3 weeks of discomfort in his left upper abdomen radiating somewhat around into had his back. This then progressed to episodes of more stabbing type pain in that area. Day after this started he had 2 episodes of bloody bowel movements and then says it his abdominal pain eased off. A week or so later his bowel movements pretty much back to normal and then 2 weeks ago he had another episode of sharp left abdominal and left upper abdominal pain radiating into his back followed by larger amounts of bloody stools. Past a lot of blood at that time and saw clots as well. This was last week and symptoms have not recurred .Daniel Zimmerman He says he has had some ongoing left-sided abdominal discomfort. Appetite has  been okay but he says he's lost 10-12 pounds over the past month or so, though he feels he is eating the same amount as usual. No  fever or chills. Labs were done on 12/21 showed hemoglobin of 15.9. He had CT of the abdomen and pelvis done on 05/08/2015 which showed left sided diverticulosis without diverticulitis, a lipomatous ileocecal valve, and focal narrowing of a short segment of the upper rectum concerning for a benign stricture versus malignancy.  Review of Systems Pertinent positive and negative review of systems were noted in the above HPI section.   All other review of systems was otherwise negative.  Outpatient Encounter Prescriptions as of 06/01/2015  Medication Sig  . atorvastatin (LIPITOR) 20 MG tablet Take 20 mg by mouth daily.  . cetirizine (ZYRTEC) 10 MG tablet Take 10 mg by mouth daily.  Daniel Zimmerman diltiazem (DILTIAZEM CD) 180 MG 24 hr capsule Take 180 mg by mouth daily.  . famotidine (PEPCID) 20 MG tablet Take 20 mg by mouth 2 (two) times daily.  Daniel Zimmerman GLUCOSAMINE-CHONDROITIN-MSM-D3 PO Take 2 capsules by mouth daily.  Daniel Zimmerman HYDROcodone-acetaminophen (NORCO/VICODIN) 5-325 MG tablet Take 1 tablet by mouth every 6 (six) hours as needed for moderate pain.  Daniel Zimmerman losartan (COZAAR) 50 MG tablet Take 50 mg by mouth daily.  . nitroGLYCERIN (NITROSTAT) 0.3 MG SL tablet Place 0.3 mg under the tongue every 5 (five) minutes as needed for chest pain.  . Omega-3 Fatty Acids (FISH OIL) 1200 MG CAPS Take 2 capsules by mouth daily.  . Na Sulfate-K Sulfate-Mg Sulf SOLN Take 1 kit by mouth once.   No facility-administered encounter medications on file as of 06/01/2015.   Allergies  Allergen Reactions  . Celebrex [Celecoxib] Swelling  . Penicillins     Was a baby when he had reaction--unknown   Patient Active Problem List   Diagnosis Date Noted  . Hx of adenomatous colonic polyps 06/01/2015  . Diverticulosis of colon without hemorrhage 06/01/2015  . HTN (hypertension) 06/01/2015  . Prinzmetal's angina ([place]) 06/01/2015  Social History   Social History  . Marital Status: Married    Spouse Name: N/A  . Number of Children: N/A  . Years of Education: N/A   Occupational History  . Not on file.   Social History Main Topics  . Smoking status: Former Smoker    Types: Cigarettes    Quit date: 10/03/2003  . Smokeless tobacco: Never Used     Comment: quit 10 years  . Alcohol Use: No     Comment: occasional  . Drug Use: No  . Sexual Activity: Yes   Other Topics Concern  . Not on file   Social History Narrative    Mr. Garlick's family history includes  Arthritis in his father, maternal grandfather, maternal grandmother, mother, paternal grandfather, and paternal grandmother; COPD in his father; Heart disease in his maternal grandmother and paternal grandmother; Hyperlipidemia in his mother; Hypertension in his maternal grandmother, maternal uncle, and mother.      Objective:    Filed Vitals:   06/01/15 0925  BP: 118/82  Pulse: 64    Physical Exam  well-developed white male in no acute distress, accompanied by his wife blood pressure 118/82 pulse 64 height 5 foot 11 weight 199 HEENT ;nontraumatic normocephalic EOMI PERRLA sclera anicteric, Cardiovascular; regular rate and rhythm with S1-S2 no murmur or gallop, Pulmonary; clear bilaterally, Abdomen; soft sounds are present he is tender in the left mid quadrant and left lower quadrant no guarding or rebound no palpable mass or hepatosplenomegaly, Rectal; exam not done, Extremities ;no clubbing cyanosis or edema skin warm and dry, Neuropsych; mood and affect appropriate     Assessment & Plan:   #1 55 yo male with onset LUQ/LMQ abdominal pain ones month ago associated with two distinct episodes of bloody stools- abnormal CT with focal narrowing in upper rectum-?srticture vs malignancy R/O ischemic colitis with development of inflammatory stricture, R/O malignancy, R/O IBD #2 hx of sessile serrated  Polyps 01/2014 #3 diverticulosis #4HTN #5 probable Prinzmetal Angina- Dr Tamsen Snider #6 Weight loss - secondary to #1  Plan; CBC, CMET  Schedule for Colonoscopy as soon as possible with Dr Atilano Ina discussed in detail  with pt and he is agreeable to proceed.  Amy Genia Harold PA-C 06/01/2015   Cc: Jearld Fenton, NP

## 2015-06-01 NOTE — Patient Instructions (Signed)
Please go to the basement level to have your labs drawn.  You have been scheduled for a colonoscopy. Please follow written instructions given to you at your visit today.  Please pick up your prep supplies at the pharmacy within the next 1-3 days. [facility].  If you use inhalers (even only as needed), please bring them with you on the day of your procedure. Your physician has requested that you go to www.startemmi.com and enter the access code given to you at your visit today. This web site gives a general overview about your procedure. However, you should still follow specific instructions given to you by our office regarding your preparation for the procedure.

## 2015-06-02 NOTE — Progress Notes (Signed)
Agree with assessment and plans as outlined 

## 2015-06-05 ENCOUNTER — Ambulatory Visit (AMBULATORY_SURGERY_CENTER): Payer: 59 | Admitting: Internal Medicine

## 2015-06-05 ENCOUNTER — Encounter: Payer: Self-pay | Admitting: Internal Medicine

## 2015-06-05 VITALS — BP 121/83 | HR 65 | Temp 97.8°F | Resp 14 | Ht 71.0 in | Wt 199.0 lb

## 2015-06-05 DIAGNOSIS — K625 Hemorrhage of anus and rectum: Secondary | ICD-10-CM

## 2015-06-05 DIAGNOSIS — K5730 Diverticulosis of large intestine without perforation or abscess without bleeding: Secondary | ICD-10-CM

## 2015-06-05 DIAGNOSIS — R1012 Left upper quadrant pain: Secondary | ICD-10-CM

## 2015-06-05 DIAGNOSIS — Z86010 Personal history of colonic polyps: Secondary | ICD-10-CM

## 2015-06-05 DIAGNOSIS — R935 Abnormal findings on diagnostic imaging of other abdominal regions, including retroperitoneum: Secondary | ICD-10-CM | POA: Diagnosis not present

## 2015-06-05 MED ORDER — SODIUM CHLORIDE 0.9 % IV SOLN: 500.0000 mL | INTRAVENOUS | Status: DC

## 2015-06-05 NOTE — Progress Notes (Signed)
Patient awakening,vss,report to rn 

## 2015-06-05 NOTE — Patient Instructions (Signed)
DAILY FIBER SUPPLEMENTATION WITH METAMUCIL, 1 - 2 TABLESPOONS IN 12 OUNCES OF WATER.    YOU HAD AN ENDOSCOPIC PROCEDURE TODAY AT [facility]:   Refer to the procedure report that was given to you for any specific questions about what was found during the examination.  If the procedure report does not answer your questions, please call your gastroenterologist to clarify.  If you requested that your care partner not be given the details of your procedure findings, then the procedure report has been included in a sealed envelope for you to review at your convenience later.  YOU SHOULD EXPECT: Some feelings of bloating in the abdomen. Passage of more gas than usual.  Walking can help get rid of the air that was put into your GI tract during the procedure and reduce the bloating. If you had a lower endoscopy (such as a colonoscopy or flexible sigmoidoscopy) you may notice spotting of blood in your stool or on the toilet paper. If you underwent a bowel prep for your procedure, you may not have a normal bowel movement for a few days.  Please Note:  You might notice some irritation and congestion in your nose or some drainage.  This is from the oxygen used during your procedure.  There is no need for concern and it should clear up in a day or so.  SYMPTOMS TO REPORT IMMEDIATELY:   Following lower endoscopy (colonoscopy or flexible sigmoidoscopy):  Excessive amounts of blood in the stool  Significant tenderness or worsening of abdominal pains  Swelling of the abdomen that is new, acute  Fever of 100F or higher   For urgent or emergent issues, a gastroenterologist can be reached at any hour by calling [phone].   DIET: Your first meal following the procedure should be a small meal and then it is ok to progress to your normal diet. Heavy or fried foods are harder to digest and may make you feel nauseous or bloated.  Likewise, meals heavy in dairy and vegetables can increase  bloating.  Drink plenty of fluids but you should avoid alcoholic beverages for 24 hours.  ACTIVITY:  You should plan to take it easy for the rest of today and you should NOT DRIVE or use heavy machinery until tomorrow (because of the sedation medicines used during the test).    FOLLOW UP: Our staff will call the number listed on your records the next business day following your procedure to check on you and address any questions or concerns that you may have regarding the information given to you following your procedure. If we do not reach you, we will leave a message.  However, if you are feeling well and you are not experiencing any problems, there is no need to return our call.  We will assume that you have returned to your regular daily activities without incident.  If any biopsies were taken you will be contacted by phone or by letter within the next 1-3 weeks.  Please call us at [phone] if you have not heard about the biopsies in 3 weeks.    SIGNATURES/CONFIDENTIALITY: You and/or your care partner have signed paperwork which will be entered into your electronic medical record.  These signatures attest to the fact that that the information above on your After Visit Summary has been reviewed and is understood.  Full responsibility of the confidentiality of this discharge information lies with you and/or your care-partner.

## 2015-06-05 NOTE — Op Note (Signed)
[place]  [address]. [place], [id number]   COLONOSCOPY PROCEDURE REPORT  PATIENT: Daniel, Zimmerman  MR#: [id number] BIRTHDATE: [birth year] , 39  yrs. old GENDER: male ENDOSCOPIST: Eustace Quail, MD REFERRED [id number] Baity NP. PROCEDURE DATE:  06/05/2015 PROCEDURE:   Colonoscopy, diagnostic  ASA CLASS:   Class II INDICATIONS:Evaluation of unexplained GI bleeding, left upper quadrant pain, and an abnormal CT(narrowing of the upper rectum). Prior colonoscopy September 2015 was sessile serrated polyps. MEDICATIONS: Monitored anesthesia care and Propofol 300 mg IV  DESCRIPTION OF PROCEDURE:   After the risks benefits and alternatives of the procedure were thoroughly explained, informed consent was obtained.  The digital rectal exam revealed no abnormalities of the rectum.   The [id number]  endoscope was introduced through the anus and advanced to the cecum, which was identified by both the appendix and ileocecal valve. No adverse events experienced.   The quality of the prep was excellent. (Suprep was used)  The instrument was then slowly withdrawn as the colon was fully examined. Estimated blood loss is zero unless otherwise noted in this procedure report.  COLON FINDINGS: There was moderate diverticulosis noted in the left colon.   The examination was otherwise normal.  Retroflexed views revealed internal hemorrhoids. The time to cecum = 2.1 Withdrawal time = 9.6   The scope was withdrawn and the procedure completed. COMPLICATIONS: There were no immediate complications.  ENDOSCOPIC IMPRESSION: 1.   Moderate diverticulosis was noted in the left colon 2.   Internal hemorrhoids 3.   The examination was otherwise normal  RECOMMENDATIONS: 1.  Daily fiber supplementation with Metamucil one to 2 tablespoons in 12 ounces of water 2.  Follow up colonoscopy in 5 years  (history of sessile serrated polyps September 2015). Please remove all recall and  make new recall for around January 2022.  eSigned:  Eustace Quail, MD 06/05/2015 2:02 PM   cc: The Patient    ; Webb Silversmith NP

## 2015-06-08 ENCOUNTER — Telehealth: Payer: Self-pay

## 2015-06-08 NOTE — Telephone Encounter (Signed)
  Follow up Call-  Call back number 06/05/2015 02/11/2014  Post procedure Call Back phone  # [phone]  Permission to leave phone message Yes Yes     Patient questions:  Do you have a fever, pain , or abdominal swelling? No. Pain Score  0 *  Have you tolerated food without any problems? Yes.    Have you been able to return to your normal activities? Yes.    Do you have any questions about your discharge instructions: Diet   No. Medications  No. Follow up visit  No.  Do you have questions or concerns about your Care? No.  Actions: * If pain score is 4 or above: No action needed, pain <4.

## 2015-12-14 ENCOUNTER — Encounter: Payer: Self-pay | Admitting: Family Medicine

## 2015-12-14 DIAGNOSIS — Z7689 Persons encountering health services in other specified circumstances: Secondary | ICD-10-CM

## 2015-12-15 DIAGNOSIS — Z7689 Persons encountering health services in other specified circumstances: Secondary | ICD-10-CM | POA: Insufficient documentation

## 2015-12-15 NOTE — Progress Notes (Signed)
Routed to PCP 

## 2016-05-12 ENCOUNTER — Ambulatory Visit (INDEPENDENT_AMBULATORY_CARE_PROVIDER_SITE_OTHER): Payer: 59 | Admitting: Family Medicine

## 2016-05-12 ENCOUNTER — Encounter: Payer: Self-pay | Admitting: Family Medicine

## 2016-05-12 VITALS — BP 114/66 | HR 67 | Temp 98.3°F | Wt 212.5 lb

## 2016-05-12 DIAGNOSIS — J4521 Mild intermittent asthma with (acute) exacerbation: Secondary | ICD-10-CM | POA: Diagnosis not present

## 2016-05-12 MED ORDER — IPRATROPIUM BROMIDE 0.02 % IN SOLN
0.5000 mg | Freq: Once | RESPIRATORY_TRACT | Status: AC
  Administered 2016-05-12: 0.5 mg via RESPIRATORY_TRACT

## 2016-05-12 MED ORDER — AZITHROMYCIN 250 MG PO TABS: ORAL_TABLET | ORAL | 0 refills | Status: DC

## 2016-05-12 MED ORDER — HYDROCODONE-HOMATROPINE 5-1.5 MG/5ML PO SYRP: 5.0000 mL | ORAL_SOLUTION | Freq: Four times a day (QID) | ORAL | 0 refills | Status: DC | PRN

## 2016-05-12 MED ORDER — ALBUTEROL SULFATE (2.5 MG/3ML) 0.083% IN NEBU
2.5000 mg | INHALATION_SOLUTION | Freq: Once | RESPIRATORY_TRACT | Status: AC
  Administered 2016-05-12: 2.5 mg via RESPIRATORY_TRACT

## 2016-05-12 MED ORDER — PREDNISONE 20 MG PO TABS: ORAL_TABLET | ORAL | 0 refills | Status: DC

## 2016-05-12 NOTE — Patient Instructions (Signed)
For nasal congestion you can use Afrin nasal spray for 3 days max, Sudafed, saline nasal spray (generic is fine for all). Drink enough fluids to make your urine light yellow. For fever/chill/muscle aches you can take over the counter acetaminophen or ibuprofen.  Please come back in if you are not better in 5-7 days or if you develop worsening wheezing, shortness of breath or persistent vomiting. You can use your albuterol inhaler every 4-6 hours as needed for cough/wheeze.

## 2016-05-12 NOTE — Progress Notes (Signed)
Subjective:    Patient ID: Daniel Zimmerman, male    DOB: [birth year], 55 y.o.   MRN: [id number]  HPI This is a 55 yo male who presents today with one week of cough. Last week felt achy, fever 102 for two days, now has cough and congestion. Airways feel closed when he lies down. Cough dry, burning. Feels SOB, hears wheezing. Has a history of asthma and has been using albuterol inhaler every 4 hours without relief. Has been taking mucinex, OTC cough syrup. Some headache, no runny nose or ear pain. Taking ibuprofen and acetaminophen. Fever to 100 at night, fevers for 1 week. Vomited x 1 with coughing several days ago. Feels achy in joints. Wife and daughter have been sick as well. Asthma has been well controlled, has not used albuterol in a long time prior to illness. Decreased appetite, good fluid intake.   Past Medical History:  Diagnosis Date  . Allergy   . Allergy-induced asthma    in past  . Arthritis   . Chicken pox   . Colon polyps 02/11/2014   Sessile serrated polyps x 2  . GERD (gastroesophageal reflux disease)   . Heart disease   . Hyperlipidemia   . Hypertension    Past Surgical History:  Procedure Laterality Date  . ROTATOR CUFF REPAIR Right   . SPINE SURGERY     Cervical--ruptured disc  . WRIST SURGERY Left    Family History  Problem Relation Age of Onset  . Arthritis Mother   . Hyperlipidemia Mother   . Hypertension Mother   . Arthritis Father   . COPD Father   . Hypertension Maternal Uncle   . Arthritis Maternal Grandmother   . Heart disease Maternal Grandmother   . Hypertension Maternal Grandmother   . Arthritis Maternal Grandfather   . Arthritis Paternal Grandmother   . Heart disease Paternal Grandmother   . Arthritis Paternal Grandfather    Social History  Substance Use Topics  . Smoking status: Former Smoker    Types: Cigarettes    Quit date: 10/03/2003  . Smokeless tobacco: Never Used     Comment: quit 10 years  . Alcohol use No     Comment:  occasional      Review of Systems     Objective:   Physical Exam  Constitutional: He is oriented to person, place, and time. He appears well-developed and well-nourished.  HENT:  Head: Normocephalic and atraumatic.  Right Ear: Tympanic membrane, external ear and ear canal normal.  Left Ear: Tympanic membrane, external ear and ear canal normal.  Nose: Mucosal edema and rhinorrhea present.  Mouth/Throat: Uvula is midline and oropharynx is clear and moist.  Eyes: Conjunctivae are normal.  Neck: Normal range of motion. Neck supple.  Cardiovascular: Normal rate, regular rhythm and normal heart sounds.   Pulmonary/Chest: Effort normal. He has wheezes (expiratory throughout, sounds tight.).  Musculoskeletal: Normal range of motion.  Lymphadenopathy:    He has no cervical adenopathy.  Neurological: He is alert and oriented to person, place, and time.  Skin: Skin is warm and dry.  Psychiatric: He has a normal mood and affect. His behavior is normal. Judgment and thought content normal.  Vitals reviewed.     BP 114/66   Pulse 67   Temp 98.3 F (36.8 C) (Oral)   Wt 212 lb 8 oz (96.4 kg)   SpO2 93%   BMI 29.64 kg/m  Wt Readings from Last 3 Encounters:  05/12/16 212 lb 8 oz (  96.4 kg)  06/05/15 199 lb (90.3 kg)  06/01/15 199 lb 6.4 oz (90.4 kg)   Nebulizer treatment of albuterol/atroven given in office with subjective improvement and decreased wheezing on exam.     Assessment & Plan:  1. Mild intermittent asthma with acute exacerbation - Provided written and verbal information regarding diagnosis and treatment. - RTC precautions reviewed - albuterol (PROVENTIL) (2.5 MG/3ML) 0.083% nebulizer solution 2.5 mg; Take 3 mLs (2.5 mg total) by nebulization once. - ipratropium (ATROVENT) nebulizer solution 0.5 mg; Take 2.5 mLs (0.5 mg total) by nebulization once. - azithromycin (ZITHROMAX) 250 MG tablet; Take 2 tabs PO x 1 dose, then 1 tab PO QD x 4 days  Dispense: 6 tablet; Refill:  0 - HYDROcodone-homatropine (HYCODAN) 5-1.5 MG/5ML syrup; Take 5 mLs by mouth every 6 (six) hours as needed for cough.  Dispense: 120 mL; Refill: 0 - predniSONE (DELTASONE) 20 MG tablet; Take 3 tablets x 3 days, then 2 x 3 days, then 1 x 3 days  Dispense: 18 tablet; Refill: 0   Clarene Reamer, FNP-BC  [facility]  05/12/2016 11:37 AM

## 2016-08-29 ENCOUNTER — Ambulatory Visit (INDEPENDENT_AMBULATORY_CARE_PROVIDER_SITE_OTHER): Payer: 59 | Admitting: Internal Medicine

## 2016-08-29 ENCOUNTER — Encounter: Payer: Self-pay | Admitting: Internal Medicine

## 2016-08-29 VITALS — BP 122/80 | HR 76 | Temp 98.0°F | Wt 213.0 lb

## 2016-08-29 DIAGNOSIS — J301 Allergic rhinitis due to pollen: Secondary | ICD-10-CM | POA: Diagnosis not present

## 2016-08-29 MED ORDER — METHYLPREDNISOLONE ACETATE 80 MG/ML IJ SUSP
80.0000 mg | Freq: Once | INTRAMUSCULAR | Status: AC
Start: 2016-08-29 — End: 2016-08-29
  Administered 2016-08-29: 80 mg via INTRAMUSCULAR

## 2016-08-29 NOTE — Addendum Note (Signed)
Addended by: Lurlean Nanny on: 08/29/2016 11:58 AM   Modules accepted: Orders

## 2016-08-29 NOTE — Patient Instructions (Signed)
Allergic Rhinitis Allergic rhinitis is when the mucous membranes in the nose respond to allergens. Allergens are particles in the air that cause your body to have an allergic reaction. This causes you to release allergic antibodies. Through a chain of events, these eventually cause you to release histamine into the blood stream. Although meant to protect the body, it is this release of histamine that causes your discomfort, such as frequent sneezing, congestion, and an itchy, runny nose. What are the causes? Seasonal allergic rhinitis (hay fever) is caused by pollen allergens that may come from grasses, trees, and weeds. Year-round allergic rhinitis (perennial allergic rhinitis) is caused by allergens such as house dust mites, pet dander, and mold spores. What are the signs or symptoms?  Nasal stuffiness (congestion).  Itchy, runny nose with sneezing and tearing of the eyes. How is this diagnosed? Your health care provider can help you determine the allergen or allergens that trigger your symptoms. If you and your health care provider are unable to determine the allergen, skin or blood testing may be used. Your health care provider will diagnose your condition after taking your health history and performing a physical exam. Your health care provider may assess you for other related conditions, such as asthma, pink eye, or an ear infection. How is this treated? Allergic rhinitis does not have a cure, but it can be controlled by:  Medicines that block allergy symptoms. These may include allergy shots, nasal sprays, and oral antihistamines.  Avoiding the allergen. Hay fever may often be treated with antihistamines in pill or nasal spray forms. Antihistamines block the effects of histamine. There are over-the-counter medicines that may help with nasal congestion and swelling around the eyes. Check with your health care provider before taking or giving this medicine. If avoiding the allergen or the  medicine prescribed do not work, there are many new medicines your health care provider can prescribe. Stronger medicine may be used if initial measures are ineffective. Desensitizing injections can be used if medicine and avoidance does not work. Desensitization is when a patient is given ongoing shots until the body becomes less sensitive to the allergen. Make sure you follow up with your health care provider if problems continue. Follow these instructions at home: It is not possible to completely avoid allergens, but you can reduce your symptoms by taking steps to limit your exposure to them. It helps to know exactly what you are allergic to so that you can avoid your specific triggers. Contact a health care provider if:  You have a fever.  You develop a cough that does not stop easily (persistent).  You have shortness of breath.  You start wheezing.  Symptoms interfere with normal daily activities. This information is not intended to replace advice given to you by your health care provider. Make sure you discuss any questions you have with your health care provider. Document Released: 01/25/2001 Document Revised: 01/01/2016 Document Reviewed: 01/07/2013 Elsevier Interactive Patient Education  2017 [employer]  

## 2016-08-29 NOTE — Progress Notes (Signed)
HPI  Pt presents to the clinic today with c/o sore throat and cough. This started 3-4 days ago. He denies difficulty swallowing. The cough is non productive. He has run fevers up to 101, had chills and body aches. He denies runny nose, nasal congestion, or ear pain. He has tried Tylenol with minimal relief. He has a history of allergy induced asthma. He has not had sick contacts that he is aware of.   Review of Systems        Past Medical History:  Diagnosis Date  . Allergy   . Allergy-induced asthma    in past  . Arthritis   . Chicken pox   . Colon polyps 02/11/2014   Sessile serrated polyps x 2  . GERD (gastroesophageal reflux disease)   . Heart disease   . Hyperlipidemia   . Hypertension     Family History  Problem Relation Age of Onset  . Arthritis Mother   . Hyperlipidemia Mother   . Hypertension Mother   . Arthritis Father   . COPD Father   . Hypertension Maternal Uncle   . Arthritis Maternal Grandmother   . Heart disease Maternal Grandmother   . Hypertension Maternal Grandmother   . Arthritis Maternal Grandfather   . Arthritis Paternal Grandmother   . Heart disease Paternal Grandmother   . Arthritis Paternal Grandfather     Social History   Social History  . Marital status: Married    Spouse name: N/A  . Number of children: N/A  . Years of education: N/A   Occupational History  . Not on file.   Social History Main Topics  . Smoking status: Former Smoker    Types: Cigarettes    Quit date: 10/03/2003  . Smokeless tobacco: Never Used     Comment: quit 10 years  . Alcohol use No     Comment: occasional  . Drug use: No  . Sexual activity: Yes   Other Topics Concern  . Not on file   Social History Narrative  . No narrative on file    Allergies  Allergen Reactions  . Celebrex [Celecoxib] Swelling  . Penicillins     Was a baby when he had reaction--unknown     Constitutional: Positive fever. Denies headache, fatigue or abrupt weight changes.   HEENT:  Positive sore throat. Denies eye redness, eye pain, pressure behind the eyes, facial pain, nasal congestion, ear pain, ringing in the ears, wax buildup, runny nose or bloody nose. Respiratory: Positive cough. Denies difficulty breathing or shortness of breath.  Cardiovascular: Denies chest pain, chest tightness, palpitations or swelling in the hands or feet.   No other specific complaints in a complete review of systems (except as listed in HPI above).  Objective:   BP 122/80   Pulse 76   Temp 98 F (36.7 C) (Oral)   Wt 213 lb (96.6 kg)   SpO2 97%   BMI 29.71 kg/m  Wt Readings from Last 3 Encounters:  08/29/16 213 lb (96.6 kg)  05/12/16 212 lb 8 oz (96.4 kg)  06/05/15 199 lb (90.3 kg)     General: Appears his stated age, in NAD. HEENT: Head: normal shape and size, no sinus tenderness; Ears: Tm's gray and intact, normal light reflex; Nose: mucosa pink and moist, septum midline; Throat/Mouth: + PND. Teeth present, mucosa pink and moist, no exudate noted, no lesions or ulcerations noted.  Neck: No cervical lymphadenopathy.  Cardiovascular: Normal rate and rhythm.  Pulmonary/Chest: Normal effort and positive vesicular  breath sounds. No respiratory distress. No wheezes, rales or ronchi noted.       Assessment & Plan:   Allergic Rhinitis:  Get some rest and drink plenty of water Do salt water gargles for the sore throat Start Zyrtec and Flonase OTC 80 mg Depo IM today  RTC as needed or if symptoms persist.   Webb Silversmith, NP

## 2017-02-01 ENCOUNTER — Ambulatory Visit (INDEPENDENT_AMBULATORY_CARE_PROVIDER_SITE_OTHER): Payer: 59 | Admitting: Internal Medicine

## 2017-02-01 ENCOUNTER — Encounter: Payer: Self-pay | Admitting: Internal Medicine

## 2017-02-01 VITALS — BP 120/78 | HR 76 | Temp 98.2°F | Wt 213.0 lb

## 2017-02-01 DIAGNOSIS — K5900 Constipation, unspecified: Secondary | ICD-10-CM

## 2017-02-01 DIAGNOSIS — R140 Abdominal distension (gaseous): Secondary | ICD-10-CM | POA: Diagnosis not present

## 2017-02-01 LAB — COMPREHENSIVE METABOLIC PANEL
ALBUMIN: 4.2 g/dL (ref 3.5–5.2)
ALT: 59 U/L — ABNORMAL HIGH (ref 0–53)
AST: 30 U/L (ref 0–37)
Alkaline Phosphatase: 74 U/L (ref 39–117)
BUN: 9 mg/dL (ref 6–23)
CALCIUM: 9.2 mg/dL (ref 8.4–10.5)
CHLORIDE: 105 meq/L (ref 96–112)
CO2: 30 mEq/L (ref 19–32)
Creatinine, Ser: 0.9 mg/dL (ref 0.40–1.50)
GFR: 93.1 mL/min (ref >60.00)
Glucose, Bld: 97 mg/dL (ref 70–99)
POTASSIUM: 4.4 meq/L (ref 3.5–5.1)
SODIUM: 140 meq/L (ref 135–145)
Total Bilirubin: 0.4 mg/dL (ref 0.2–1.2)
Total Protein: 6.9 g/dL (ref 6.0–8.3)

## 2017-02-01 LAB — CBC
HEMATOCRIT: 47.4 % (ref 39.0–52.0)
HEMOGLOBIN: 15.8 g/dL (ref 13.0–17.0)
MCHC: 33.4 g/dL (ref 30.0–36.0)
MCV: 89.3 fl (ref 78.0–100.0)
Platelets: 221 10*3/uL (ref 150.0–400.0)
RBC: 5.3 Mil/uL (ref 4.22–5.81)
RDW: 13.6 % (ref 11.5–15.5)
WBC: 6 10*3/uL (ref 4.0–10.5)

## 2017-02-01 NOTE — Progress Notes (Signed)
Subjective:    Patient ID: Daniel Zimmerman, male    DOB: [birth year], 56 y.o.   MRN: [id number]  HPI  Pt presents to the clinic today with c/o abdominal mass. He reports he has noticed that the right side of his belly button is bigger than the left. The mass is not tender. He started noticing this over the last 5-6 weeks. He reports he has been experiencing more constipation. He denies nausea, vomiting or blood in his stool. He has tried increasing his fiber and OTC laxatives with some relief. He had a CT scan of the abdomen in 2016 which showed a small inguinal hernia on the left. He had a colonoscopy in 2016 which showed some diverticulosis.   Review of Systems      Past Medical History:  Diagnosis Date  . Allergy   . Allergy-induced asthma    in past  . Arthritis   . Chicken pox   . Colon polyps 02/11/2014   Sessile serrated polyps x 2  . GERD (gastroesophageal reflux disease)   . Heart disease   . Hyperlipidemia   . Hypertension     Current Outpatient Prescriptions  Medication Sig Dispense Refill  . atorvastatin (LIPITOR) 20 MG tablet Take 20 mg by mouth daily.  3  . Calcium Polycarbophil (FIBER-CAPS PO) Take 1 capsule by mouth 2 (two) times daily.    . cetirizine (ZYRTEC) 10 MG tablet Take 10 mg by mouth daily.    Marland Kitchen diltiazem (DILTIAZEM CD) 180 MG 24 hr capsule Take 180 mg by mouth daily.    . famotidine (PEPCID) 20 MG tablet Take 20 mg by mouth 2 (two) times daily.    . nitroGLYCERIN (NITROSTAT) 0.3 MG SL tablet Place 0.3 mg under the tongue every 5 (five) minutes as needed for chest pain.     No current facility-administered medications for this visit.     Allergies  Allergen Reactions  . Celebrex [Celecoxib] Swelling  . Penicillins     Was a baby when he had reaction--unknown    Family History  Problem Relation Age of Onset  . Arthritis Mother   . Hyperlipidemia Mother   . Hypertension Mother   . Arthritis Father   . COPD Father   . Hypertension Maternal  Uncle   . Arthritis Maternal Grandmother   . Heart disease Maternal Grandmother   . Hypertension Maternal Grandmother   . Arthritis Maternal Grandfather   . Arthritis Paternal Grandmother   . Heart disease Paternal Grandmother   . Arthritis Paternal Grandfather     Social History   Social History  . Marital status: Married    Spouse name: N/A  . Number of children: N/A  . Years of education: N/A   Occupational History  . Not on file.   Social History Main Topics  . Smoking status: Former Smoker    Types: Cigarettes    Quit date: 10/03/2003  . Smokeless tobacco: Never Used     Comment: quit 10 years  . Alcohol use No     Comment: occasional  . Drug use: No  . Sexual activity: Yes   Other Topics Concern  . Not on file   Social History Narrative  . No narrative on file     Constitutional: Denies fever, malaise, fatigue, headache or abrupt weight changes.  HGastrointestinal: Pt reports constipation. Denies abdominal pain, bloating, diarrhea or blood in the stool.  GU: Denies urgency, frequency, pain with urination, burning sensation, blood in urine, odor  or discharge. Skin: Pt reports mass of abdominal wall. Denies redness, rashes, lesions or ulcercations.   No other specific complaints in a complete review of systems (except as listed in HPI above).  Objective:   Physical Exam  BP 120/78   Pulse 76   Temp 98.2 F (36.8 C) (Oral)   Wt 213 lb (96.6 kg)   SpO2 97%   BMI 29.71 kg/m  Wt Readings from Last 3 Encounters:  02/01/17 213 lb (96.6 kg)  08/29/16 213 lb (96.6 kg)  05/12/16 212 lb 8 oz (96.4 kg)    General: Appears his stated age, obese in NAD. Skin: Small umbilical hernia noted. Abdomen: Mildly distended but soft. Tender in the LLQ. Hypoactive bowel sounds.   BMET    Component Value Date/Time   NA 141 06/01/2015 1046   K 4.4 06/01/2015 1046   CL 107 06/01/2015 1046   CO2 22 06/01/2015 1046   GLUCOSE 89 06/01/2015 1046   BUN 11 06/01/2015  1046   CREATININE 0.80 06/01/2015 1046   CALCIUM 8.9 06/01/2015 1046    Lipid Panel  No results found for: CHOL, TRIG, HDL, CHOLHDL, VLDL, LDLCALC  CBC    Component Value Date/Time   WBC 7.8 06/01/2015 1046   RBC 5.48 06/01/2015 1046   HGB 16.3 06/01/2015 1046   HCT 48.8 06/01/2015 1046   PLT 235.0 06/01/2015 1046   MCV 89.0 06/01/2015 1046   MCHC 33.5 06/01/2015 1046   RDW 13.6 06/01/2015 1046   LYMPHSABS 1.8 06/01/2015 1046   MONOABS 0.7 06/01/2015 1046   EOSABS 0.4 06/01/2015 1046   BASOSABS 0.1 06/01/2015 1046    Hgb A1C No results found for: HGBA1C         Assessment & Plan:   Abdominal Distention, Constipation:  Advised him to stop Dulcolax and change to Mirilax daily Cut back on the fiver to 1 cap BID Make sure you are drinking plenty of fluids.  CBC, CMET today Will refer back to GI for further evaluation, he wanted to hold off on CT scan for the time being  Return precautions discussed Webb Silversmith, NP

## 2017-02-01 NOTE — Patient Instructions (Signed)
Constipation, Adult °Constipation is when a person: °· Poops (has a bowel movement) fewer times in a week than normal. °· Has a hard time pooping. °· Has poop that is dry, hard, or bigger than normal. ° °Follow these instructions at home: °Eating and drinking ° °· Eat foods that have a lot of fiber, such as: °? Fresh fruits and vegetables. °? Whole grains. °? Beans. °· Eat less of foods that are high in fat, low in fiber, or overly processed, such as: °? French fries. °? Hamburgers. °? Cookies. °? Candy. °? Soda. °· Drink enough fluid to keep your pee (urine) clear or pale yellow. °General instructions °· Exercise regularly or as told by your doctor. °· Go to the restroom when you feel like you need to poop. Do not hold it in. °· Take over-the-counter and prescription medicines only as told by your doctor. These include any fiber supplements. °· Do pelvic floor retraining exercises, such as: °? Doing deep breathing while relaxing your lower belly (abdomen). °? Relaxing your pelvic floor while pooping. °· Watch your condition for any changes. °· Keep all follow-up visits as told by your doctor. This is important. °Contact a doctor if: °· You have pain that gets worse. °· You have a fever. °· You have not pooped for 4 days. °· You throw up (vomit). °· You are not hungry. °· You lose weight. °· You are bleeding from the anus. °· You have thin, pencil-like poop (stool). °Get help right away if: °· You have a fever, and your symptoms suddenly get worse. °· You leak poop or have blood in your poop. °· Your belly feels hard or bigger than normal (is bloated). °· You have very bad belly pain. °· You feel dizzy or you faint. °This information is not intended to replace advice given to you by your health care provider. Make sure you discuss any questions you have with your health care provider. °Document Released: 10/19/2007 Document Revised: 11/20/2015 Document Reviewed: 10/21/2015 °Elsevier Interactive Patient Education ©  2017 [employer] ° °

## 2017-02-13 ENCOUNTER — Encounter: Payer: Self-pay | Admitting: Physician Assistant

## 2017-02-13 ENCOUNTER — Ambulatory Visit (INDEPENDENT_AMBULATORY_CARE_PROVIDER_SITE_OTHER): Payer: 59 | Admitting: Physician Assistant

## 2017-02-13 VITALS — BP 112/80 | HR 76 | Ht 71.0 in | Wt 213.0 lb

## 2017-02-13 DIAGNOSIS — M549 Dorsalgia, unspecified: Secondary | ICD-10-CM

## 2017-02-13 DIAGNOSIS — G8929 Other chronic pain: Secondary | ICD-10-CM | POA: Diagnosis not present

## 2017-02-13 DIAGNOSIS — R1084 Generalized abdominal pain: Secondary | ICD-10-CM

## 2017-02-13 DIAGNOSIS — R140 Abdominal distension (gaseous): Secondary | ICD-10-CM | POA: Diagnosis not present

## 2017-02-13 NOTE — Patient Instructions (Signed)
  You have been scheduled for a CT scan of the abdomen and pelvis at [facility] ([address]---this is in the same building as [facility]).   You are scheduled on Wed 02-15-2017 at 4:00 PM. You should arrive at 3:45 PM  to your appointment time for registration. Please follow the written instructions below on the day of your exam:  WARNING: IF YOU ARE ALLERGIC TO IODINE/X-RAY DYE, PLEASE NOTIFY RADIOLOGY IMMEDIATELY AT [phone]! YOU WILL BE GIVEN A 13 HOUR PREMEDICATION PREP.  1) Do not eat or drink anything after 1:00 PM (4 hours prior to your test) 2) You have been given 2 bottles of oral contrast to drink. The solution may taste               better if refrigerated, but do NOT add ice or any other liquid to this solution. Shake             well before drinking.    Drink 1 bottle of contrast @ 2:00 Pm (2 hours prior to your exam)  Drink 1 bottle of contrast @ 3:00 PM (1 hour prior to your exam)  You may take any medications as prescribed with a small amount of water except for the following: Metformin, Glucophage, Glucovance, Avandamet, Riomet, Fortamet, Actoplus Met, Janumet, Glumetza or Metaglip. The above medications must be held the day of the exam AND 48 hours after the exam.  The purpose of you drinking the oral contrast is to aid in the visualization of your intestinal tract. The contrast solution may cause some diarrhea. Before your exam is started, you will be given a small amount of fluid to drink. Depending on your individual set of symptoms, you may also receive an intravenous injection of x-ray contrast/dye. Plan on being at [facility] for 30 minutes or long, depending on the type of exam you are having performed.  If you have any questions regarding your exam or if you need to reschedule, you may call the CT department at [phone] between the hours of 8:00 am and 5:00 pm,  Monday-Friday.  ________________________________________________________________________

## 2017-02-13 NOTE — Progress Notes (Signed)
Subjective:    Patient ID: Daniel Zimmerman, male    DOB: July 22, 1960, 56 y.o.   MRN: [id number]  HPI Obrian is a pleasant 56 year old white male, known to Dr. Henrene Pastor. He was last seen here in January 2017 when he had colonoscopy That was done for  complaints of rectal bleeding. He was found to have moderate diverticulosis and internal hemorrhoids. He is scheduled to have a 5 year interval follow-up due to history of a sessile serrated polyp found in September 2015. Patient comes in today referred by his PCP/Regina Amedeo Gory NP for complaints of abdominal pain. Patient says he had his current symptoms over the past 3 months with some progression to the point where he is unable to sleep through the night due to pain. He says his pain seems to be worse at night particularly if he tries to lie on his back he has worsening abdominal and back pain and has to turn and lay on either side. He feels discomfort across his lumbar area of his back and around bilaterally into his abdomen. He has also been taking laxatives fairly regularly as he has less abdominal discomfort if he keeps his bowels "cleared out". He says if his bowel is evacuated , sometimes he is able to sleep through the night. He feels that his abdomen is been somewhat distended over the past couple of months and he is also developed a new umbilical hernia. His appetite has been okay weight is actually up about 10 pounds is not having any postprandial abdominal pain no nausea or vomiting. Generally during the day he does fairly well, may notice some bilateral lower quadrant abdominal discomfort, no significant back pain during the day. No melena or hematochezia. CBC and chemistries on 01/31/2017 unremarkable. He has not had any imaging.   Review of Systems Pertinent positive and negative review of systems were noted in the above HPI section.  All other review of systems was otherwise negative.  Outpatient Encounter Prescriptions as of 02/13/2017    Medication Sig  . atorvastatin (LIPITOR) 20 MG tablet Take 20 mg by mouth daily.  . Calcium Polycarbophil (FIBER-CAPS PO) Take 1 capsule by mouth 2 (two) times daily.  . cetirizine (ZYRTEC) 10 MG tablet Take 10 mg by mouth daily.  Marland Kitchen diltiazem (DILTIAZEM CD) 180 MG 24 hr capsule Take 180 mg by mouth daily.  . famotidine (PEPCID) 20 MG tablet Take 20 mg by mouth 2 (two) times daily.  . nitroGLYCERIN (NITROSTAT) 0.3 MG SL tablet Place 0.3 mg under the tongue every 5 (five) minutes as needed for chest pain.   No facility-administered encounter medications on file as of 02/13/2017.    Allergies  Allergen Reactions  . Celebrex [Celecoxib] Swelling  . Penicillins     Was a baby when he had reaction--unknown   Patient Active Problem List   Diagnosis Date Noted  . Hx of adenomatous colonic polyps 06/01/2015  . HTN (hypertension) 06/01/2015  . Prinzmetal's angina ([place]) 06/01/2015   Social History   Social History  . Marital status: Married    Spouse name: N/A  . Number of children: N/A  . Years of education: N/A   Occupational History  . Not on file.   Social History Main Topics  . Smoking status: Former Smoker    Types: Cigarettes    Quit date: 10/03/2003  . Smokeless tobacco: Never Used     Comment: quit 10 years  . Alcohol use No     Comment: occasional  .  Drug use: No  . Sexual activity: Yes   Other Topics Concern  . Not on file   Social History Narrative  . No narrative on file    Mr. Angelica's family history includes Arthritis in his father, maternal grandfather, maternal grandmother, mother, paternal grandfather, and paternal grandmother; COPD in his father; Heart disease in his maternal grandmother and paternal grandmother; Hyperlipidemia in his mother; Hypertension in his maternal grandmother, maternal uncle, and mother.      Objective:    Vitals:   02/13/17 0844  BP: 112/80  Pulse: 76    Physical Exam  well-developed white male in no acute distress,  pleasant accompanied by his wife, blood pressure 112/80 pulse 76, BMI 29.7. HEENT; nontraumatic normocephalic EOMI PERRLA sclera anicteric, Cardiovascular; regular rate and rhythm with S1-S2 no murmur or gallop, Pulmonary; clear bilaterally, Abdomen ;soft, protuberant no definite fluid wave, he has tenderness and some fullness in the mid abdomen right greater than left there is a small reducible umbilical hernia, no palpable hepatosplenomegaly, Rectal ;exam not done, Extremities; no clubbing cyanosis or edema skin warm and dry, Neuropsych; mood and affect appropriate       Assessment & Plan:   #63 56 year old white male with 3 month history of bilateral mid and lower abdominal discomfort and low back pain both significantly worse at night with lying down. Patient has had associated abdominal distention, and feels much better if he takes laxatives to keep his bowel purged. Etiology of his symptoms is not clear, rule out intra-abdominal inflammatory process or neoplasm. No definite fluid wave on exam though cannot rule out ascites.  #2 history of sessile serrated adenomatous polyp 2015 due for follow-up September 2020 #3 diverticulosis #4 hypertension #5 hyperlipidemia  Plan patient will be scheduled for CT of the abdomen with IV and oral contrast He will continue MiraLAX 17 g in 8 ounces of water daily Further plans pending results of CT.  Thurza Kwiecinski S Taylie Helder PA-C 02/13/2017   Cc: Jearld Fenton, NP

## 2017-02-13 NOTE — Progress Notes (Signed)
Initial assessment and plans reviewed 

## 2017-02-15 ENCOUNTER — Ambulatory Visit (INDEPENDENT_AMBULATORY_CARE_PROVIDER_SITE_OTHER)
Admission: RE | Admit: 2017-02-15 | Discharge: 2017-02-15 | Disposition: A | Discharge status: Home / Self Care | Payer: 59 | Source: Ambulatory Visit | Attending: Physician Assistant | Admitting: Physician Assistant

## 2017-02-15 DIAGNOSIS — G8929 Other chronic pain: Secondary | ICD-10-CM

## 2017-02-15 DIAGNOSIS — M549 Dorsalgia, unspecified: Secondary | ICD-10-CM

## 2017-02-15 DIAGNOSIS — R1084 Generalized abdominal pain: Secondary | ICD-10-CM

## 2017-02-15 DIAGNOSIS — R140 Abdominal distension (gaseous): Secondary | ICD-10-CM

## 2017-02-15 MED ORDER — IOPAMIDOL (ISOVUE-300) INJECTION 61%
100.0000 mL | Freq: Once | INTRAVENOUS | Status: AC | PRN
  Administered 2017-02-15: 100 mL via INTRAVENOUS

## 2017-02-17 ENCOUNTER — Encounter: Payer: Self-pay | Admitting: Internal Medicine

## 2017-02-17 ENCOUNTER — Other Ambulatory Visit: Payer: Self-pay

## 2017-02-17 ENCOUNTER — Telehealth: Payer: Self-pay

## 2017-02-17 DIAGNOSIS — M545 Low back pain, unspecified: Secondary | ICD-10-CM

## 2017-02-17 DIAGNOSIS — K862 Cyst of pancreas: Secondary | ICD-10-CM

## 2017-02-17 NOTE — Telephone Encounter (Signed)
-----   Message from Alfredia Ferguson, PA-C sent at 02/16/2017 12:28 PM EDT ----- Please let pt know the CT scan shows a fatty liver , and a very tiny 89mm cystic lesion in tail of pancreas which radiology recommended re- imaging with MRI in one year - we can plan to do in 6 months - He has diverticulosis, and umbilical hernia and left inguinal hernia - no findings to explain his pain. I think he needs imaging of his lumbo-sacral spine next . We can order - may need to call Ct about contrast vs no contrast -  Doing scan for low back pain, worse with lying down and radiating into lower abd .  Pt should have follow up with Dr Henrene Pastor within month

## 2017-02-17 NOTE — Telephone Encounter (Signed)
Ok - glad he is going to follow up with orthopedic surgeon - they can do imaging

## 2017-02-17 NOTE — Telephone Encounter (Signed)
Patient calling back to state that he doesn't want to sch another CT scan at this point. Pt is going to contact surgeon that did his back surgery. FYI

## 2017-02-17 NOTE — Telephone Encounter (Signed)
Confirmed with the patient. He does agree with keeping his follow imaging of his pancreas. He is aware of his appointment.

## 2017-02-17 NOTE — Telephone Encounter (Signed)
Spoke with the patient about the findings and recommendations. He agrees to the plan for more imaging. He declines to schedule an appointment for follow up with Dr Henrene Pastor at this time.  He states no restrictions on his availability for imaging appointment.

## 2017-05-01 ENCOUNTER — Encounter: Payer: Self-pay | Admitting: Internal Medicine

## 2017-05-01 ENCOUNTER — Ambulatory Visit (INDEPENDENT_AMBULATORY_CARE_PROVIDER_SITE_OTHER): Payer: BLUE CROSS/BLUE SHIELD | Admitting: Internal Medicine

## 2017-05-01 VITALS — BP 134/80 | HR 67 | Temp 98.0°F | Ht 71.0 in | Wt 212.8 lb

## 2017-05-01 DIAGNOSIS — Z125 Encounter for screening for malignant neoplasm of prostate: Secondary | ICD-10-CM | POA: Diagnosis not present

## 2017-05-01 DIAGNOSIS — E7800 Pure hypercholesterolemia, unspecified: Secondary | ICD-10-CM

## 2017-05-01 DIAGNOSIS — I201 Angina pectoris with documented spasm: Secondary | ICD-10-CM | POA: Diagnosis not present

## 2017-05-01 DIAGNOSIS — Z1159 Encounter for screening for other viral diseases: Secondary | ICD-10-CM | POA: Diagnosis not present

## 2017-05-01 DIAGNOSIS — K219 Gastro-esophageal reflux disease without esophagitis: Secondary | ICD-10-CM | POA: Diagnosis not present

## 2017-05-01 DIAGNOSIS — Z0001 Encounter for general adult medical examination with abnormal findings: Secondary | ICD-10-CM | POA: Diagnosis not present

## 2017-05-01 DIAGNOSIS — E785 Hyperlipidemia, unspecified: Secondary | ICD-10-CM | POA: Insufficient documentation

## 2017-05-01 DIAGNOSIS — Z114 Encounter for screening for human immunodeficiency virus [HIV]: Secondary | ICD-10-CM | POA: Diagnosis not present

## 2017-05-01 LAB — COMPREHENSIVE METABOLIC PANEL
ALK PHOS: 75 U/L (ref 39–117)
ALT: 45 U/L (ref 0–53)
AST: 23 U/L (ref 0–37)
Albumin: 4.2 g/dL (ref 3.5–5.2)
BILIRUBIN TOTAL: 0.6 mg/dL (ref 0.2–1.2)
BUN: 11 mg/dL (ref 6–23)
CO2: 30 mEq/L (ref 19–32)
CREATININE: 0.93 mg/dL (ref 0.40–1.50)
Calcium: 8.8 mg/dL (ref 8.4–10.5)
Chloride: 105 mEq/L (ref 96–112)
GFR: 89.56 mL/min (ref >60.00)
GLUCOSE: 86 mg/dL (ref 70–99)
Potassium: 4.4 mEq/L (ref 3.5–5.1)
SODIUM: 140 meq/L (ref 135–145)
TOTAL PROTEIN: 6.6 g/dL (ref 6.0–8.3)

## 2017-05-01 LAB — CBC
HCT: 46.4 % (ref 39.0–52.0)
HEMOGLOBIN: 15.5 g/dL (ref 13.0–17.0)
MCHC: 33.5 g/dL (ref 30.0–36.0)
MCV: 89.6 fl (ref 78.0–100.0)
Platelets: 225 10*3/uL (ref 150.0–400.0)
RBC: 5.18 Mil/uL (ref 4.22–5.81)
RDW: 13.4 % (ref 11.5–15.5)
WBC: 6 10*3/uL (ref 4.0–10.5)

## 2017-05-01 LAB — LIPID PANEL
Cholesterol: 113 mg/dL (ref 0–200)
HDL: 39.1 mg/dL (ref >39.00)
LDL Cholesterol: 54 mg/dL (ref 0–99)
NONHDL: 73.57
Total CHOL/HDL Ratio: 3
Triglycerides: 97 mg/dL (ref 0.0–149.0)
VLDL: 19.4 mg/dL (ref 0.0–40.0)

## 2017-05-01 LAB — PSA: PSA: 0.95 ng/mL (ref 0.10–4.00)

## 2017-05-01 NOTE — Assessment & Plan Note (Signed)
Continue Diltiazem and Nitro prn He will follow up with Dr. Einar Gip next month

## 2017-05-01 NOTE — Progress Notes (Signed)
Subjective:    Patient ID: Daniel Zimmerman, male    DOB: [birth year], 56 y.o.   MRN: [id number]  HPI  Pt presents to the clinic today for his annual exam. He is also due to follow up chronic conditions.  HLD: His last LDL was 76, 12/2014. He denies myalgias on Atorvastatin. He does try to consume a low fat diet.  GERD: No issues on Pepcid. He denies breakthrough symptoms.  Prinzmetal's Angina: His BP today is 134/80. He denies any recent chest pain on Diltiazem. He has noticed some swelling in his legs and thinks the Diltiazem is the cause. He has Nitro but has not had to use this. He follows with Dr. Nadyne Coombes.  Flu: 03/2017 Tetanus: > 10 years ago PSA Screening: 11/2014 Colon Screening: 05/2015 Vision Screening: as needed Dentist: annually  Diet: He does eat meat. He consumes fruits and veggies daily. He tries to avoid fried food. He drinks mostly water. Exercise: None outside of work.  Review of Systems      Past Medical History:  Diagnosis Date  . Allergy   . Allergy-induced asthma    in past  . Arthritis   . Chicken pox   . Colon polyps 02/11/2014   Sessile serrated polyps x 2  . GERD (gastroesophageal reflux disease)   . Heart disease   . Hyperlipidemia   . Hypertension     Current Outpatient Medications  Medication Sig Dispense Refill  . atorvastatin (LIPITOR) 20 MG tablet Take 20 mg by mouth daily.  3  . Calcium Polycarbophil (FIBER-CAPS PO) Take 1 capsule by mouth 2 (two) times daily.    . cetirizine (ZYRTEC) 10 MG tablet Take 10 mg by mouth daily.    Marland Kitchen diltiazem (DILTIAZEM CD) 180 MG 24 hr capsule Take 180 mg by mouth daily.    . famotidine (PEPCID) 20 MG tablet Take 20 mg by mouth 2 (two) times daily.    . nitroGLYCERIN (NITROSTAT) 0.3 MG SL tablet Place 0.3 mg under the tongue every 5 (five) minutes as needed for chest pain.     No current facility-administered medications for this visit.     Allergies  Allergen Reactions  . Celebrex [Celecoxib] Swelling    . Penicillins     Was a baby when he had reaction--unknown    Family History  Problem Relation Age of Onset  . Arthritis Mother   . Hyperlipidemia Mother   . Hypertension Mother   . Arthritis Father   . COPD Father   . Hypertension Maternal Uncle   . Arthritis Maternal Grandmother   . Heart disease Maternal Grandmother   . Hypertension Maternal Grandmother   . Arthritis Maternal Grandfather   . Arthritis Paternal Grandmother   . Heart disease Paternal Grandmother   . Arthritis Paternal Grandfather     Social History   Socioeconomic History  . Marital status: Married    Spouse name: Not on file  . Number of children: Not on file  . Years of education: Not on file  . Highest education level: Not on file  Social Needs  . Financial resource strain: Not on file  . Food insecurity - worry: Not on file  . Food insecurity - inability: Not on file  . Transportation needs - medical: Not on file  . Transportation needs - non-medical: Not on file  Occupational History  . Not on file  Tobacco Use  . Smoking status: Former Smoker    Types: Cigarettes    Last  attempt to quit: 10/03/2003    Years since quitting: 13.5  . Smokeless tobacco: Never Used  . Tobacco comment: quit 10 years  Substance and Sexual Activity  . Alcohol use: No    Alcohol/week: 0.0 oz    Comment: occasional  . Drug use: No  . Sexual activity: Yes  Other Topics Concern  . Not on file  Social History Narrative  . Not on file     Constitutional: Denies fever, malaise, fatigue, headache or abrupt weight changes.  HEENT: Denies eye pain, eye redness, ear pain, ringing in the ears, wax buildup, runny nose, nasal congestion, bloody nose, or sore throat. Respiratory: Denies difficulty breathing, shortness of breath, cough or sputum production.   Cardiovascular: Pt reports intermittent lower extremity swelling. Denies chest pain, chest tightness, palpitations or swelling in the hands.  Gastrointestinal:  Denies abdominal pain, bloating, constipation, diarrhea or blood in the stool.  GU: Denies urgency, frequency, pain with urination, burning sensation, blood in urine, odor or discharge. Musculoskeletal: Denies decrease in range of motion, difficulty with gait, muscle pain or joint pain and swelling.  Skin: Denies redness, rashes, lesions or ulcercations.  Neurological: Denies dizziness, difficulty with memory, difficulty with speech or problems with balance and coordination.  Psych: Denies anxiety, depression, SI/HI.  No other specific complaints in a complete review of systems (except as listed in HPI above).  Objective:   Physical Exam   BP 134/80   Pulse 67   Temp 98 F (36.7 C) (Oral)   Ht 5\' 11"  (1.803 m)   Wt 212 lb 12.8 oz (96.5 kg)   SpO2 98%   BMI 29.68 kg/m  Wt Readings from Last 3 Encounters:  05/01/17 212 lb 12.8 oz (96.5 kg)  02/13/17 213 lb (96.6 kg)  02/01/17 213 lb (96.6 kg)    General: Appears his stated age, well developed, well nourished in NAD. Skin: Warm, dry and intact. Multiple seborrheic keratosis noted. HEENT: Head: normal shape and size; Eyes: sclera white, no icterus, conjunctiva pink, PERRLA and EOMs intact; Ears: Tm's gray and intact, normal light reflex;  Throat/Mouth: Teeth present, mucosa pink and moist, no exudate, lesions or ulcerations noted.  Neck:  Neck supple, trachea midline. No masses, lumps or thyromegaly present.  Cardiovascular: Normal rate and rhythm. S1,S2 noted.  No murmur, rubs or gallops noted. No JVD or BLE edema. No carotid bruits noted. Pulmonary/Chest: Normal effort and positive vesicular breath sounds. No respiratory distress. No wheezes, rales or ronchi noted.  Abdomen: Soft and nontender. Normal bowel sounds. No distention or masses noted. Liver, spleen and kidneys non palpable. Musculoskeletal: Strength 5/5 BUE/BLE. No difficulty with gait.  Neurological: Alert and oriented. Cranial nerves II-XII grossly intact. Coordination  normal.  Psychiatric: Mood and affect normal. Behavior is normal. Judgment and thought content normal.    BMET    Component Value Date/Time   NA 140 02/01/2017 1411   K 4.4 02/01/2017 1411   CL 105 02/01/2017 1411   CO2 30 02/01/2017 1411   GLUCOSE 97 02/01/2017 1411   BUN 9 02/01/2017 1411   CREATININE 0.90 02/01/2017 1411   CALCIUM 9.2 02/01/2017 1411    Lipid Panel  No results found for: CHOL, TRIG, HDL, CHOLHDL, VLDL, LDLCALC  CBC    Component Value Date/Time   WBC 6.0 02/01/2017 1411   RBC 5.30 02/01/2017 1411   HGB 15.8 02/01/2017 1411   HCT 47.4 02/01/2017 1411   PLT 221.0 02/01/2017 1411   MCV 89.3 02/01/2017 1411  MCHC 33.4 02/01/2017 1411   RDW 13.6 02/01/2017 1411   LYMPHSABS 1.8 06/01/2015 1046   MONOABS 0.7 06/01/2015 1046   EOSABS 0.4 06/01/2015 1046   BASOSABS 0.1 06/01/2015 1046    Hgb A1C No results found for: HGBA1C         Assessment & Plan:   Preventative Health Maintenance:  Flu shot UTD He declines tetanus booster today Colon screening UTD Encouraged him to consume a balanced diet and exercise regimen Advised him to see an eye doctor and dentist annually Will check CBC, CMET, Lipid, PSA, HIV and Hep C today  RTC in 1 year, sooner if needed Webb Silversmith, NP

## 2017-05-01 NOTE — Patient Instructions (Signed)

## 2017-05-01 NOTE — Assessment & Plan Note (Signed)
Controlled on Pepcid Discussed how weight loss may help improve reflux

## 2017-05-01 NOTE — Assessment & Plan Note (Signed)
CMET and lipid profile today Encouraged him to consume a low fat diet Continue Atorvastatin for now 

## 2017-05-02 LAB — HEPATITIS C ANTIBODY
Hepatitis C Ab: NONREACTIVE
SIGNAL TO CUT-OFF: 0.01 (ref <1.00)

## 2017-05-02 LAB — HIV ANTIBODY (ROUTINE TESTING W REFLEX): HIV 1&2 Ab, 4th Generation: NONREACTIVE

## 2017-05-10 ENCOUNTER — Encounter (INDEPENDENT_AMBULATORY_CARE_PROVIDER_SITE_OTHER): Payer: Self-pay | Admitting: Specialist

## 2017-05-10 ENCOUNTER — Ambulatory Visit (INDEPENDENT_AMBULATORY_CARE_PROVIDER_SITE_OTHER): Payer: BLUE CROSS/BLUE SHIELD

## 2017-05-10 ENCOUNTER — Ambulatory Visit (INDEPENDENT_AMBULATORY_CARE_PROVIDER_SITE_OTHER): Payer: BLUE CROSS/BLUE SHIELD | Admitting: Specialist

## 2017-05-10 VITALS — BP 135/87 | HR 66 | Ht 69.0 in | Wt 215.0 lb

## 2017-05-10 DIAGNOSIS — M545 Low back pain: Secondary | ICD-10-CM

## 2017-05-10 DIAGNOSIS — R202 Paresthesia of skin: Secondary | ICD-10-CM

## 2017-05-10 DIAGNOSIS — R200 Anesthesia of skin: Secondary | ICD-10-CM | POA: Diagnosis not present

## 2017-05-10 DIAGNOSIS — G8929 Other chronic pain: Secondary | ICD-10-CM

## 2017-05-10 DIAGNOSIS — M79604 Pain in right leg: Secondary | ICD-10-CM

## 2017-05-10 DIAGNOSIS — M5441 Lumbago with sciatica, right side: Secondary | ICD-10-CM | POA: Diagnosis not present

## 2017-05-10 DIAGNOSIS — M5442 Lumbago with sciatica, left side: Secondary | ICD-10-CM

## 2017-05-10 DIAGNOSIS — M79605 Pain in left leg: Secondary | ICD-10-CM

## 2017-05-10 MED ORDER — GABAPENTIN 300 MG PO CAPS: 300.0000 mg | ORAL_CAPSULE | Freq: Three times a day (TID) | ORAL | 3 refills | Status: DC

## 2017-05-10 NOTE — Patient Instructions (Signed)
Avoid frequent bending and stooping  No lifting greater than 10 lbs. May use ice or moist heat for pain. Weight loss is of benefit. MRI of the lumbar spine with and without contrast. Gabapentin 300 mg po at night. Tylenol 500 mg two tablet 3 times per day maximum. Lumbar exercises both flexion and extension.

## 2017-05-10 NOTE — Progress Notes (Signed)
Office Visit Note   Patient: Daniel Zimmerman           Date of Birth: 1960/05/19           MRN: [id number] Visit Date: 05/10/2017              Requested by: Jearld Fenton, NP [address] [place],  [address] PCP: Jearld Fenton, NP   Assessment & Plan: Visit Diagnoses:  1. Chronic bilateral low back pain with bilateral sciatica   2. Lumbar pain with radiation down both legs   3. Numbness and tingling of both legs   56 year old male with pain at rest and nocturnal low back pain. He is experiencing pain that prevents sleep. Pain pattern not really specific for stenosis, discogenic or mechanical back pain as he is able to ride in cars and at rest pain is worsened. GI work up has been negative and he is not having weight loss. As pain is  Worsening over the last one year I recommend imaging  Studies of the lumbar spine MRI with contrast to assess for occult malignancy or infection. Will trial gabapentin and avoid NSAIDs due to diverticular disease, try tylenol up to 3000 mg per day. PSA CBC and CMET performed recently negative. CT scan with hypertrophic prostate changes. Arthritis panel today.    Plan:Avoid frequent bending and stooping  No lifting greater than 10 lbs. May use ice or moist heat for pain. Weight loss is of benefit. MRI of the lumbar spine with and without contrast. Gabapentin 300 mg po at night. Tylenol 500 mg two tablet 3 times per day maximum. Lumbar exercises both flexion and extension.  Follow-Up Instructions: Return in about 3 weeks (around 05/31/2017).   Orders:  Orders Placed This Encounter  Procedures  . XR Lumbar Spine 2-3 Views  . MR Lumbar Spine W Wo Contrast   Meds ordered this encounter  Medications  . gabapentin (NEURONTIN) 300 MG capsule    Sig: Take 1 capsule (300 mg total) by mouth 3 (three) times daily.    Dispense:  30 capsule    Refill:  3      Procedures: No procedures performed   Clinical Data: No additional  findings.   Subjective: Chief Complaint  Patient presents with  . Lower Back - Pain    56 year old male with at least one year history of increasing LBP, he has had some discomfort for years, told in 67s he had arthritis up and down his back. No bladder difficulty, does have constipation with diverticulosis/diverticulitis. Does not take NSAIDs due to BRBPR when taken. He has had internal exam and has been told there is nothing internal that is causing this pain. Weight in the the past year is gain, due to decreased exercises, does however walk up to 5-7 miles per day. Not on Sundays, he is working 5 days a week, averaging 3-3 1/2 hours of sleep at night due to pain. He has had blood at primary care MD and cardiology, He has [place] angina. He has had tests recently by cardiology, Dr.Ganji. He has pain into the back and with numbness and paresthesias in the legs, feeling of coldness in the legs. Legs and feet with sitting, severe pain in the back when lying down. Feels better standing and walking. No weakness.  Riding in car without difficulty, bending in the AM with stiffness, trouble reaching shoes and socks, especially at first in the AM.  Review of Systems  Constitutional: Positive for activity change. Negative for appetite change, chills, diaphoresis, fatigue, fever and unexpected weight change.  HENT: Positive for nosebleeds. Negative for congestion, dental problem, drooling, ear discharge, ear pain, facial swelling, mouth sores, rhinorrhea, sinus pressure, sinus pain, sneezing, sore throat, tinnitus and trouble swallowing.   Eyes: Negative.  Negative for photophobia, pain, discharge, redness, itching and visual disturbance.  Respiratory: Positive for cough. Negative for apnea, choking, chest tightness, shortness of breath and wheezing.   Cardiovascular: Positive for leg swelling. Negative for chest pain and palpitations.  Gastrointestinal: Negative.  Negative for abdominal  distention, abdominal pain, anal bleeding, blood in stool, constipation, diarrhea, nausea, rectal pain and vomiting.  Endocrine: Negative.  Negative for cold intolerance, heat intolerance, polydipsia, polyphagia and polyuria.  Genitourinary: Negative.  Negative for difficulty urinating, dysuria, enuresis, flank pain, frequency and hematuria.  Musculoskeletal: Positive for arthralgias, back pain and gait problem. Negative for joint swelling, myalgias, neck pain and neck stiffness.  Skin: Negative.  Negative for color change, pallor, rash and wound.  Allergic/Immunologic: Positive for environmental allergies. Negative for food allergies.  Neurological: Positive for numbness. Negative for dizziness, tremors, seizures, syncope, facial asymmetry, speech difficulty, weakness, light-headedness and headaches.  Hematological: Negative.  Negative for adenopathy. Does not bruise/bleed easily.  Psychiatric/Behavioral: Negative.  Negative for agitation, behavioral problems, confusion, decreased concentration, dysphoric mood, hallucinations, self-injury, sleep disturbance and suicidal ideas. The patient is not nervous/anxious and is not hyperactive.      Objective: Vital Signs: BP 135/87   Pulse 66   Ht 5\' 9"  (1.753 m)   Wt 215 lb (97.5 kg)   BMI 31.75 kg/m   Physical Exam  Constitutional: He is oriented to person, place, and time. He appears well-developed and well-nourished.  HENT:  Head: Normocephalic and atraumatic.  Eyes: EOM are normal. Pupils are equal, round, and reactive to light.  Neck: Normal range of motion. Neck supple.  Pulmonary/Chest: Effort normal and breath sounds normal.  Abdominal: Soft. Bowel sounds are normal.  Neurological: He is alert and oriented to person, place, and time.  Skin: Skin is warm and dry.  Psychiatric: He has a normal mood and affect. His behavior is normal. Judgment and thought content normal.    Back Exam   Tenderness  The patient is experiencing  tenderness in the lumbar.  Range of Motion  Extension: normal  Flexion:  70 abnormal  Lateral bend right: abnormal  Lateral bend left: abnormal  Rotation right: abnormal  Rotation left: abnormal   Muscle Strength  The patient has normal back strength. Right Quadriceps:  5/5  Right Hamstrings:  5/5   Tests  Straight leg raise right: negative Straight leg raise left: negative  Reflexes  Patellar: normal Achilles: normal Babinski's sign: normal   Other  Toe walk: normal Heel walk: normal Sensation: normal Gait: normal  Erythema: no back redness Scars: absent      Specialty Comments:  No specialty comments available.  Imaging: Xr Lumbar Spine 2-3 Views  Result Date: 05/10/2017 AP and lateral radiographs in flexion and extension show mild DDD L1-2 and L2-3 with narrowing of the disc spaces no listhesis, mild narrowing of the left SI joint no significant hip DJD. Mild spondylosis and mild DDD.     PMFS History: Patient Active Problem List   Diagnosis Date Noted  . HLD (hyperlipidemia) 05/01/2017  . GERD (gastroesophageal reflux disease) 05/01/2017  . Prinzmetal's angina ([place]) 06/01/2015   Past Medical History:  Diagnosis Date  . Allergy   .  Allergy-induced asthma    in past  . Arthritis   . Chicken pox   . Colon polyps 02/11/2014   Sessile serrated polyps x 2  . GERD (gastroesophageal reflux disease)   . Heart disease   . Hyperlipidemia   . Hypertension     Family History  Problem Relation Age of Onset  . Arthritis Mother   . Hyperlipidemia Mother   . Hypertension Mother   . Arthritis Father   . COPD Father   . Hypertension Maternal Uncle   . Arthritis Maternal Grandmother   . Heart disease Maternal Grandmother   . Hypertension Maternal Grandmother   . Arthritis Maternal Grandfather   . Arthritis Paternal Grandmother   . Heart disease Paternal Grandmother   . Arthritis Paternal Grandfather     Past Surgical History:  Procedure Laterality  Date  . ROTATOR CUFF REPAIR Right   . SPINE SURGERY     Cervical--ruptured disc  . WRIST SURGERY Left    Social History   Occupational History  . Not on file  Tobacco Use  . Smoking status: Former Smoker    Types: Cigarettes    Last attempt to quit: 10/03/2003    Years since quitting: 13.6  . Smokeless tobacco: Never Used  . Tobacco comment: quit 10 years  Substance and Sexual Activity  . Alcohol use: No    Alcohol/week: 0.0 oz    Comment: occasional  . Drug use: No  . Sexual activity: Yes

## 2017-05-11 LAB — ANA: Anti Nuclear Antibody(ANA): NEGATIVE

## 2017-05-11 LAB — C-REACTIVE PROTEIN: CRP: 1.9 mg/L (ref <8.0)

## 2017-05-11 LAB — RHEUMATOID FACTOR: Rhuematoid fact SerPl-aCnc: <14 IU/mL (ref <14)

## 2017-05-11 LAB — SEDIMENTATION RATE: Sed Rate: 2 mm/h (ref 0–20)

## 2017-05-16 HISTORY — PX: UMBILICAL HERNIA REPAIR: SHX196

## 2017-05-20 ENCOUNTER — Ambulatory Visit
Admission: RE | Admit: 2017-05-20 | Discharge: 2017-05-20 | Disposition: A | Discharge status: Home / Self Care | Payer: BLUE CROSS/BLUE SHIELD | Source: Ambulatory Visit | Attending: Specialist | Admitting: Specialist

## 2017-05-20 DIAGNOSIS — R200 Anesthesia of skin: Secondary | ICD-10-CM | POA: Diagnosis not present

## 2017-05-20 DIAGNOSIS — M79604 Pain in right leg: Secondary | ICD-10-CM

## 2017-05-20 DIAGNOSIS — R202 Paresthesia of skin: Secondary | ICD-10-CM

## 2017-05-20 DIAGNOSIS — M79605 Pain in left leg: Secondary | ICD-10-CM

## 2017-05-20 DIAGNOSIS — M5442 Lumbago with sciatica, left side: Principal | ICD-10-CM

## 2017-05-20 DIAGNOSIS — G8929 Other chronic pain: Secondary | ICD-10-CM

## 2017-05-20 DIAGNOSIS — M5441 Lumbago with sciatica, right side: Principal | ICD-10-CM

## 2017-05-20 DIAGNOSIS — M545 Low back pain: Secondary | ICD-10-CM

## 2017-05-20 MED ORDER — GADOBENATE DIMEGLUMINE 529 MG/ML IV SOLN: 20.0000 mL | Freq: Once | INTRAVENOUS | Status: DC | PRN

## 2017-06-08 ENCOUNTER — Encounter (INDEPENDENT_AMBULATORY_CARE_PROVIDER_SITE_OTHER): Payer: Self-pay | Admitting: Specialist

## 2017-06-08 ENCOUNTER — Ambulatory Visit (INDEPENDENT_AMBULATORY_CARE_PROVIDER_SITE_OTHER): Payer: BLUE CROSS/BLUE SHIELD | Admitting: Specialist

## 2017-06-08 ENCOUNTER — Telehealth: Payer: Self-pay | Admitting: Internal Medicine

## 2017-06-08 VITALS — BP 134/91 | HR 69 | Ht 70.0 in | Wt 213.0 lb

## 2017-06-08 DIAGNOSIS — G8929 Other chronic pain: Secondary | ICD-10-CM | POA: Diagnosis not present

## 2017-06-08 DIAGNOSIS — M5136 Other intervertebral disc degeneration, lumbar region: Secondary | ICD-10-CM | POA: Diagnosis not present

## 2017-06-08 DIAGNOSIS — I10 Essential (primary) hypertension: Secondary | ICD-10-CM | POA: Diagnosis not present

## 2017-06-08 DIAGNOSIS — E7800 Pure hypercholesterolemia, unspecified: Secondary | ICD-10-CM | POA: Diagnosis not present

## 2017-06-08 DIAGNOSIS — R0789 Other chest pain: Secondary | ICD-10-CM | POA: Diagnosis not present

## 2017-06-08 DIAGNOSIS — M5442 Lumbago with sciatica, left side: Secondary | ICD-10-CM | POA: Diagnosis not present

## 2017-06-08 MED ORDER — METHYLPREDNISOLONE 4 MG PO TBPK: ORAL_TABLET | ORAL | 0 refills | Status: DC

## 2017-06-08 MED ORDER — GABAPENTIN 400 MG PO CAPS: 800.0000 mg | ORAL_CAPSULE | Freq: Two times a day (BID) | ORAL | 3 refills | Status: DC

## 2017-06-08 MED ORDER — TRAMADOL HCL 50 MG PO TABS: 50.0000 mg | ORAL_TABLET | Freq: Four times a day (QID) | ORAL | 0 refills | Status: DC | PRN

## 2017-06-08 NOTE — Telephone Encounter (Unsigned)
Copied from [place]. Topic: Quick Communication - See Telephone Encounter >> Jun 08, 2017  2:59 PM Hewitt Shorts wrote: CRM for notification. See Telephone encounter for: pt is calling stating that pt would like to transfer care to Dr. Deborra Medina from [place]  and the office explains this has to be done in the next two weeks so that when he is going to need to see Dr. Deborra Medina he can best number [phone] if questions contact wife  06/08/17.

## 2017-06-08 NOTE — Patient Instructions (Signed)
Avoid frequent bending and stooping  No lifting greater than 10 lbs. May use ice or moist heat for pain. Weight loss is of benefit.   

## 2017-06-08 NOTE — Progress Notes (Signed)
Office Visit Note   Patient: SOVEREIGN RAMIRO           Date of Birth: [birth year]           MRN: [id number] Visit Date: 06/08/2017              Requested by: Jearld Fenton, NP [address] [place], [place] [address] PCP: Jearld Fenton, NP   Assessment & Plan: Visit Diagnoses:  1. Chronic midline low back pain with left-sided sciatica   2. Degenerative disc disease, lumbar     Plan: Avoid frequent bending and stooping  No lifting greater than 10 lbs. May use ice or moist heat for pain. Weight loss is of benefit.  Follow-Up Instructions: Return in about 4 weeks (around 07/06/2017).   Orders:  No orders of the defined types were placed in this encounter.  No orders of the defined types were placed in this encounter.     Procedures: No procedures performed   Clinical Data: No additional findings.   Subjective: Chief Complaint  Patient presents with  . Lower Back - Follow-up    MRI Review    57 year old male with back pain and this is worse with lying down. Severe pain at night only able to sleep for 3 hours at a time. He has a hard time getting started. He is unable to move in the morning. Sometimes takes an hour to get moving in the morning. He had on mild herniation    Review of Systems   Objective: Vital Signs: BP (!) 134/91 (BP Location: Left Arm, Patient Position: Sitting)   Pulse 69   Ht 5\' 10"  (1.778 m)   Wt 213 lb (96.6 kg)   BMI 30.56 kg/m   Physical Exam  Ortho Exam  Specialty Comments:  No specialty comments available.  Imaging: No results found.   PMFS History: Patient Active Problem List   Diagnosis Date Noted  . HLD (hyperlipidemia) 05/01/2017  . GERD (gastroesophageal reflux disease) 05/01/2017  . Prinzmetal's angina ([place]) 06/01/2015   Past Medical History:  Diagnosis Date  . Allergy   . Allergy-induced asthma    in past  . Arthritis   . Chicken pox   . Colon polyps 02/11/2014   Sessile serrated polyps x 2  .  GERD (gastroesophageal reflux disease)   . Heart disease   . Hyperlipidemia   . Hypertension     Family History  Problem Relation Age of Onset  . Arthritis Mother   . Hyperlipidemia Mother   . Hypertension Mother   . Arthritis Father   . COPD Father   . Hypertension Maternal Uncle   . Arthritis Maternal Grandmother   . Heart disease Maternal Grandmother   . Hypertension Maternal Grandmother   . Arthritis Maternal Grandfather   . Arthritis Paternal Grandmother   . Heart disease Paternal Grandmother   . Arthritis Paternal Grandfather     Past Surgical History:  Procedure Laterality Date  . ROTATOR CUFF REPAIR Right   . SPINE SURGERY     Cervical--ruptured disc  . WRIST SURGERY Left    Social History   Occupational History  . Not on file  Tobacco Use  . Smoking status: Former Smoker    Types: Cigarettes    Last attempt to quit: 10/03/2003    Years since quitting: 13.6  . Smokeless tobacco: Never Used  . Tobacco comment: quit 10 years  Substance and Sexual Activity  . Alcohol use: No  Alcohol/week: 0.0 oz    Comment: occasional  . Drug use: No  . Sexual activity: Yes

## 2017-06-09 ENCOUNTER — Telehealth (INDEPENDENT_AMBULATORY_CARE_PROVIDER_SITE_OTHER): Payer: Self-pay | Admitting: Radiology

## 2017-06-09 MED ORDER — GABAPENTIN 400 MG PO CAPS: ORAL_CAPSULE | ORAL | 3 refills | Status: DC

## 2017-06-09 NOTE — Telephone Encounter (Signed)
Received fax from [facility] requesting clarification on prescription instructions for Gabapentin 400mg .    Per Dr. Louanne Skye, take 2 capsules at bedtime and take 1 capsule in the morning.   New script sent to pharmacy.

## 2017-06-09 NOTE — Telephone Encounter (Signed)
I spoke with wife/I have him scheduled for 2.13.19 @ 7:15am/she states that he usually leaves for work at 4:30am so that time would be best and assured me that he would be here on time/thx dmf

## 2017-06-09 NOTE — Telephone Encounter (Signed)
Wife is asking if pt can be seen sooner than the first new pt appt on May 6. Pt will be transferring from [place], so is not really new. Pt is hoping for appt in Feb because they are expecting a new grandchild May 1.

## 2017-06-12 ENCOUNTER — Telehealth (INDEPENDENT_AMBULATORY_CARE_PROVIDER_SITE_OTHER): Payer: Self-pay

## 2017-06-12 ENCOUNTER — Telehealth (INDEPENDENT_AMBULATORY_CARE_PROVIDER_SITE_OTHER): Payer: Self-pay | Admitting: Specialist

## 2017-06-12 NOTE — Telephone Encounter (Signed)
Patient's wife called back with information for PT.   [place], [place] [address] [phone].

## 2017-06-12 NOTE — Telephone Encounter (Signed)
Patient called asking for a PT order to be sent to the [facility] in [facility] since it would be a lot closer than [facility] for him. CB # [phone]

## 2017-06-12 NOTE — Telephone Encounter (Signed)
Patient is going to call back with a Phone #, Fax #, and address to this location so I can find it in our system.

## 2017-06-14 NOTE — Telephone Encounter (Signed)
Patient called asking for a PT order to be sent to the [facility] in [facility] since it would be a lot closer than [place] for him.---Is this ok to change on referral?

## 2017-06-20 NOTE — Telephone Encounter (Signed)
Sent to Referral Que to make the change

## 2017-06-28 ENCOUNTER — Ambulatory Visit: Payer: BLUE CROSS/BLUE SHIELD | Admitting: Family Medicine

## 2017-06-28 ENCOUNTER — Encounter: Payer: Self-pay | Admitting: Family Medicine

## 2017-06-28 VITALS — BP 110/80 | HR 80 | Temp 98.9°F | Ht 70.0 in | Wt 214.2 lb

## 2017-06-28 DIAGNOSIS — I201 Angina pectoris with documented spasm: Secondary | ICD-10-CM | POA: Diagnosis not present

## 2017-06-28 DIAGNOSIS — Z7689 Persons encountering health services in other specified circumstances: Secondary | ICD-10-CM | POA: Diagnosis not present

## 2017-06-28 DIAGNOSIS — E7800 Pure hypercholesterolemia, unspecified: Secondary | ICD-10-CM

## 2017-06-28 DIAGNOSIS — K219 Gastro-esophageal reflux disease without esophagitis: Secondary | ICD-10-CM

## 2017-06-28 NOTE — Patient Instructions (Signed)
Great to meet you! 

## 2017-06-28 NOTE — Assessment & Plan Note (Signed)
History and chart reviewed.  Questions answered.

## 2017-06-28 NOTE — Progress Notes (Signed)
Subjective:   Patient ID: Daniel Zimmerman, male    DOB: 1960-08-14, 57 y.o.   MRN: [id number]  Daniel Zimmerman is a pleasant 57 y.o. year old male who presents to clinic today with Transfer of Care (Patient is here today to transfer care from [facility] to Dr. Deborra Medina.  He is currently fasting.  He is going to see Dr. Louanne Skye on 2.21.19 for his back.  He has been having several issues and they have run several tests (CT, MRI, ED Colonoscopy).  He does express that the only time that he is not hurting severely in his lower back is when he is standing and he has a hard time sleeping at night.)  on 06/28/2017  HPI:  CPX with previous PCP on 05/01/17- note reviewed.  He is having low back pain- seeing Dr. Louanne Skye on 07/06/17.  HLD- has been well controlled with lipitor. Lab Results  Component Value Date   CHOL 113 05/01/2017   HDL 39.10 05/01/2017   LDLCALC 54 05/01/2017   TRIG 97.0 05/01/2017   CHOLHDL 3 05/01/2017   GERD: No issues on Pepcid. He denies breakthrough symptoms.  H/o Prinzmetal's angina- followed by cardiology, Dr. Nadyne Coombes.  He is on dilatizem and has prn NTG that he rarely uses.  Health Maintenance  Topic Date Due  . TETANUS/TDAP  06/28/2018 (Originally 01/15/1981)  . COLONOSCOPY  06/04/2020  . INFLUENZA VACCINE  Completed  . Hepatitis C Screening  Completed  . HIV Screening  Completed   Current Outpatient Medications on File Prior to Visit  Medication Sig Dispense Refill  . aspirin 81 MG chewable tablet Chew by mouth daily.    Marland Kitchen atorvastatin (LIPITOR) 20 MG tablet Take 20 mg by mouth daily.  3  . Calcium Polycarbophil (FIBER-CAPS PO) Take 1 capsule by mouth 2 (two) times daily.    . cetirizine (ZYRTEC) 10 MG tablet Take 10 mg by mouth daily.    Marland Kitchen diltiazem (DILTIAZEM CD) 180 MG 24 hr capsule Take 180 mg by mouth daily.    . famotidine (PEPCID) 20 MG tablet Take 20 mg by mouth 2 (two) times daily.    Marland Kitchen gabapentin (NEURONTIN) 400 MG capsule Take 2 capsules po at bedtime and  1 capsule po in the AM. 90 capsule 3  . L-Arginine 1000 MG TABS Take by mouth.    . nitroGLYCERIN (NITROSTAT) 0.3 MG SL tablet Place 0.3 mg under the tongue every 5 (five) minutes as needed for chest pain.    . traMADol (ULTRAM) 50 MG tablet Take 1 tablet (50 mg total) by mouth every 6 (six) hours as needed. 50 tablet 0  . Turmeric 500 MG CAPS Take by mouth.     No current facility-administered medications on file prior to visit.     Allergies  Allergen Reactions  . Celebrex [Celecoxib] Swelling  . Penicillins     Was a baby when he had reaction--unknown    Past Medical History:  Diagnosis Date  . Allergy   . Allergy-induced asthma    in past  . Arthritis   . Chicken pox   . Colon polyps 02/11/2014   Sessile serrated polyps x 2  . GERD (gastroesophageal reflux disease)   . Heart disease   . Hyperlipidemia   . Hypertension     Past Surgical History:  Procedure Laterality Date  . ROTATOR CUFF REPAIR Right   . SPINE SURGERY     Cervical--ruptured disc  . WRIST SURGERY Left  Family History  Problem Relation Age of Onset  . Arthritis Mother   . Hyperlipidemia Mother   . Hypertension Mother   . Arthritis Father   . COPD Father   . Hypertension Maternal Uncle   . Arthritis Maternal Grandmother   . Heart disease Maternal Grandmother   . Hypertension Maternal Grandmother   . Arthritis Maternal Grandfather   . Arthritis Paternal Grandmother   . Heart disease Paternal Grandmother   . Arthritis Paternal Grandfather     Social History   Socioeconomic History  . Marital status: Married    Spouse name: Not on file  . Number of children: Not on file  . Years of education: Not on file  . Highest education level: Not on file  Social Needs  . Financial resource strain: Not on file  . Food insecurity - worry: Not on file  . Food insecurity - inability: Not on file  . Transportation needs - medical: Not on file  . Transportation needs - non-medical: Not on file    Occupational History  . Not on file  Tobacco Use  . Smoking status: Former Smoker    Types: Cigarettes    Last attempt to quit: 10/03/2003    Years since quitting: 13.7  . Smokeless tobacco: Never Used  . Tobacco comment: quit 10 years  Substance and Sexual Activity  . Alcohol use: No    Alcohol/week: 0.0 oz    Comment: occasional  . Drug use: No  . Sexual activity: Yes  Other Topics Concern  . Not on file  Social History Narrative  . Not on file   The PMH, PSH, Social History, Family History, Medications, and allergies have been reviewed in [facility], and have been updated if relevant.  Review of Systems  Constitutional: Negative.   HENT: Negative.   Eyes: Negative.   Respiratory: Negative.   Cardiovascular: Negative.   Gastrointestinal: Negative.   Endocrine: Negative.   Genitourinary: Negative.   Musculoskeletal: Negative.   Skin: Negative.   Allergic/Immunologic: Negative.   Neurological: Negative.   Hematological: Negative.   Psychiatric/Behavioral: Negative.   All other systems reviewed and are negative.      Objective:    BP 110/80 (BP Location: Left Arm, Patient Position: Sitting, Cuff Size: Normal)   Pulse 80   Temp 98.9 F (37.2 C) (Oral)   Ht 5\' 10"  (1.778 m)   Wt 214 lb 3.2 oz (97.2 kg)   SpO2 95%   BMI 30.73 kg/m    Physical Exam  General:  pleasant male in no acute distress Eyes:  PERRL Ears:  External ear exam shows no significant lesions or deformities.  TMs normal bilaterally Hearing is grossly normal bilaterally. Nose:  External nasal examination shows no deformity or inflammation. Nasal mucosa are pink and moist without lesions or exudates. Mouth:  Oral mucosa and oropharynx without lesions or exudates.  Teeth in good repair. Neck:  no carotid bruit or thyromegaly no cervical or supraclavicular lymphadenopathy  Lungs:  Normal respiratory effort, chest expands symmetrically. Lungs are clear to auscultation, no crackles or wheezes. Heart:   Normal rate and regular rhythm. S1 and S2 normal without gallop, murmur, click, rub or other extra sounds. Abdomen:  Bowel sounds positive,abdomen soft and non-tender without masses, organomegaly or hernias noted. Pulses:  R and L posterior tibial pulses are full and equal bilaterally  Extremities:  no edema  Psych:  Good eye contact, not anxious or depressed appearing  Assessment & Plan:   Prinzmetal's angina ([place])  Pure hypercholesterolemia  Gastroesophageal reflux disease without esophagitis  Encounter to establish care with new doctor No Follow-up on file.

## 2017-06-28 NOTE — Assessment & Plan Note (Signed)
Quiet on H2 blocker. No changes made.

## 2017-06-28 NOTE — Assessment & Plan Note (Signed)
Followed by cardiology. No changes made- on diltiazem and as needed NTG.

## 2017-06-28 NOTE — Assessment & Plan Note (Signed)
Well controlled on current dose of statin. No changes made.

## 2017-07-06 ENCOUNTER — Ambulatory Visit (INDEPENDENT_AMBULATORY_CARE_PROVIDER_SITE_OTHER): Payer: BLUE CROSS/BLUE SHIELD | Admitting: Specialist

## 2017-07-06 ENCOUNTER — Encounter (INDEPENDENT_AMBULATORY_CARE_PROVIDER_SITE_OTHER): Payer: Self-pay | Admitting: Specialist

## 2017-07-06 VITALS — BP 134/92 | HR 63 | Ht 70.0 in | Wt 213.0 lb

## 2017-07-06 DIAGNOSIS — G8929 Other chronic pain: Secondary | ICD-10-CM | POA: Diagnosis not present

## 2017-07-06 DIAGNOSIS — M545 Low back pain: Secondary | ICD-10-CM

## 2017-07-06 DIAGNOSIS — M4726 Other spondylosis with radiculopathy, lumbar region: Secondary | ICD-10-CM

## 2017-07-06 NOTE — Progress Notes (Addendum)
Office Visit Note   Patient: Daniel Zimmerman           Date of Birth: 08-28-1960           MRN: [id number] Visit Date: 07/06/2017              Requested by: Jearld Fenton, NP [address] [place], [place] [address] PCP: Lucille Passy, MD   Assessment & Plan: Visit Diagnoses:  1. Chronic bilateral low back pain without sciatica   2. Other spondylosis with radiculopathy, lumbar region   57 year old male with primary low back pain complains without focal deficity. Clinical normal exam with pain localizing to the mid lumbar spine. MRI from last month is negative for nerve compression but did show very mild facet degeneration with edema within the facets L3-4 greater than L2-3. Will proceed with facet blocks. Presently I do not see a surgical solution To his back pain and I am hopeful that facet block with improve his pain and perhaps a RFA wiill give him an adequate  Pain relief.   Plan:Avoid bending, stooping and avoid lifting weights greater than 10 lbs. Avoid prolong standing and walking. Avoid frequent bending and stooping  No lifting greater than 10 lbs. May use ice or moist heat for pain. Weight loss is of benefit. Handicap license is approved. Dr. Romona Curls secretary/Assistant will call to arrange for L3-4 facet  injection   Follow-Up Instructions: Return in about 4 weeks (around 08/03/2017).   Orders:  No orders of the defined types were placed in this encounter.  No orders of the defined types were placed in this encounter.     Procedures: No procedures performed   Clinical Data: No additional findings.   Subjective: Chief Complaint  Patient presents with  . Lower Back - Follow-up    57 year old male with back pain with pain nearly daily and he is awaiting starting PT. He does exercises daily for one hour and then on weekends with the use of a TENS unit. No bowel or  Bladder differents. There is no numbness or tingling in his legs but he notices  coolness in his back.  Warmth helps to the point he is using moist heating pads some and it helps and the use of the TENS unit.    Review of Systems  Constitutional: Negative.   HENT: Negative.   Eyes: Negative.   Respiratory: Negative.   Cardiovascular: Negative.   Gastrointestinal: Negative.   Endocrine: Negative.   Genitourinary: Negative.   Musculoskeletal: Negative.   Skin: Negative.   Allergic/Immunologic: Negative.   Neurological: Negative.   Hematological: Negative.   Psychiatric/Behavioral: Negative.      Objective: Vital Signs: BP (!) 134/92 (BP Location: Left Arm, Patient Position: Sitting)   Pulse 63   Ht 5\' 10"  (1.778 m)   Wt 213 lb (96.6 kg)   BMI 30.56 kg/m   Physical Exam  Constitutional: He is oriented to person, place, and time. He appears well-developed and well-nourished.  HENT:  Head: Normocephalic and atraumatic.  Eyes: EOM are normal. Pupils are equal, round, and reactive to light.  Neck: Normal range of motion. Neck supple.  Pulmonary/Chest: Effort normal and breath sounds normal.  Abdominal: Soft. Bowel sounds are normal.  Musculoskeletal: Normal range of motion.  Neurological: He is alert and oriented to person, place, and time.  Skin: Skin is warm and dry.  Psychiatric: He has a normal mood and affect. His behavior is normal.  Judgment and thought content normal.    Ortho Exam  Specialty Comments:  No specialty comments available.  Imaging: No results found.   PMFS History: Patient Active Problem List   Diagnosis Date Noted  . Encounter to establish care with new doctor 06/28/2017  . HLD (hyperlipidemia) 05/01/2017  . GERD (gastroesophageal reflux disease) 05/01/2017  . Prinzmetal's angina ([place]) 06/01/2015   Past Medical History:  Diagnosis Date  . Allergy   . Allergy-induced asthma    in past  . Arthritis   . Chicken pox   . Colon polyps 02/11/2014   Sessile serrated polyps x 2  . GERD (gastroesophageal reflux disease)     . Heart disease   . Hyperlipidemia   . Hypertension     Family History  Problem Relation Age of Onset  . Arthritis Mother   . Hyperlipidemia Mother   . Hypertension Mother   . Arthritis Father   . COPD Father   . Hypertension Maternal Uncle   . Arthritis Maternal Grandmother   . Heart disease Maternal Grandmother   . Hypertension Maternal Grandmother   . Arthritis Maternal Grandfather   . Arthritis Paternal Grandmother   . Heart disease Paternal Grandmother   . Arthritis Paternal Grandfather     Past Surgical History:  Procedure Laterality Date  . ROTATOR CUFF REPAIR Right   . SPINE SURGERY     Cervical--ruptured disc  . WRIST SURGERY Left    Social History   Occupational History  . Not on file  Tobacco Use  . Smoking status: Former Smoker    Types: Cigarettes    Last attempt to quit: 10/03/2003    Years since quitting: 13.7  . Smokeless tobacco: Never Used  . Tobacco comment: quit 10 years  Substance and Sexual Activity  . Alcohol use: No    Alcohol/week: 0.0 oz    Comment: occasional  . Drug use: No  . Sexual activity: Yes

## 2017-07-06 NOTE — Patient Instructions (Signed)
Avoid bending, stooping and avoid lifting weights greater than 10 lbs. Avoid prolong standing and walking. Avoid frequent bending and stooping  No lifting greater than 10 lbs. May use ice or moist heat for pain. Weight loss is of benefit. Handicap license is approved. Dr. Romona Curls secretary/Assistant will call to arrange for L3-4 facet  injection

## 2017-07-24 ENCOUNTER — Ambulatory Visit (INDEPENDENT_AMBULATORY_CARE_PROVIDER_SITE_OTHER): Payer: BLUE CROSS/BLUE SHIELD | Admitting: Physical Medicine and Rehabilitation

## 2017-07-24 ENCOUNTER — Ambulatory Visit (INDEPENDENT_AMBULATORY_CARE_PROVIDER_SITE_OTHER): Payer: BLUE CROSS/BLUE SHIELD

## 2017-07-24 ENCOUNTER — Encounter (INDEPENDENT_AMBULATORY_CARE_PROVIDER_SITE_OTHER): Payer: Self-pay | Admitting: Physical Medicine and Rehabilitation

## 2017-07-24 VITALS — BP 137/101 | HR 98 | Temp 98.3°F

## 2017-07-24 DIAGNOSIS — M47816 Spondylosis without myelopathy or radiculopathy, lumbar region: Secondary | ICD-10-CM

## 2017-07-24 MED ORDER — METHYLPREDNISOLONE ACETATE 80 MG/ML IJ SUSP
80.0000 mg | Freq: Once | INTRAMUSCULAR | Status: AC
  Administered 2017-07-24: 80 mg

## 2017-07-24 NOTE — Procedures (Signed)
Lumbar Diagnostic Facet Joint Nerve Block with Fluoroscopic Guidance   Patient: Daniel Zimmerman      Date of Birth: 04-Feb-1961 MRN: [id number] PCP: Lucille Passy, MD      Visit Date: 07/24/2017   Universal Protocol:    Date/Time: 03/11/191:03 PM  Consent Given By: the patient  Position: PRONE  Additional Comments: Vital signs were monitored before and after the procedure. Patient was prepped and draped in the usual sterile fashion. The correct patient, procedure, and site was verified.   Injection Procedure Details:  Procedure Site One Meds Administered:  Meds ordered this encounter  Medications  . methylPREDNISolone acetate (DEPO-MEDROL) injection 80 mg     Laterality: Bilateral  Location/Site: Bilateral L2 to L3 medial branches L3-L4  Needle size: 22 ga.  Needle type:spinal  Needle Placement: Oblique pedical  Findings:   -Comments: There was excellent flow of contrast along the articular pillars without intravascular flow.  Procedure Details: The fluoroscope beam is vertically oriented in AP and then obliqued 15 to 20 degrees to the ipsilateral side of the desired nerve to achieve the "Scotty dog" appearance.  The skin over the target area of the junction of the superior articulating process and the transverse process (sacral ala if blocking the L5 dorsal rami) was locally anesthetized with a 1 ml volume of 1% Lidocaine without Epinephrine.  The spinal needle was inserted and advanced in a trajectory view down to the target.   After contact with periosteum and negative aspirate for blood and CSF, correct placement without intravascular or epidural spread was confirmed by injecting 0.5 ml. of Isovue-250.  A spot radiograph was obtained of this image.    Next, a 0.5 ml. volume of the injectate described above was injected. The needle was then redirected to the other facet joint nerves mentioned above if needed.  Prior to the procedure, the patient was given a Pain  Diary which was completed for baseline measurements.  After the procedure, the patient rated their pain every 30 minutes and will continue rating at this frequency for a total of 5 hours.  The patient has been asked to complete the Diary and return to [place] by mail, fax or hand delivered as soon as possible.   Additional Comments:  The patient tolerated the procedure well No complications occurred Dressing: Band-Aid    Post-procedure details: Patient was observed during the procedure. Post-procedure instructions were reviewed.  Patient left the clinic in stable condition.

## 2017-07-24 NOTE — Progress Notes (Signed)
Daniel Zimmerman - 57 y.o. male MRN [id number]  Date of birth: [birth year]  Office Visit Note: Visit Date: 07/24/2017 PCP: Lucille Passy, MD Referred by: Lucille Passy, MD  Subjective: Chief Complaint  Patient presents with  . Lower Back - Pain  . Right Leg - Pain   HPI: Mr. Serio is a 57 year old gentleman who is accompanied by his wife who provides some of the history.  He comes in today at the request of Dr. Louanne Skye for bilateral L3-4 facet joint medial branch block.  He is having mostly axial low back pain that is been ongoing for 6-8 months.  He reports his worsening symptoms are really trying to lay flat and once he gets to sleep he likes to wake up after 3 hours or so and excruciating back pain.  He has not had recent physical therapy or chiropractic care.  He has had MRI imaging of the lumbar spine which is reviewed below.  Patient really seems to have a fairly good lumbar spine overall with some mild facet arthropathy.  Depending on the relief he gets with the pain diary in the diagnostic block I think the best approach would be regrouping with physical therapy and then depending on that would look at a second diagnostic block and radiofrequency ablation.  He will continue to follow with Dr. Louanne Skye.  He does have a follow-up with him that we made today.    ROS Otherwise per HPI.  Assessment & Plan: Visit Diagnoses:  1. Spondylosis without myelopathy or radiculopathy, lumbar region     Plan: No additional findings.   Meds & Orders:  Meds ordered this encounter  Medications  . methylPREDNISolone acetate (DEPO-MEDROL) injection 80 mg    Orders Placed This Encounter  Procedures  . Facet Injection  . XR C-ARM NO REPORT    Follow-up: Return in about 2 weeks (around 08/07/2017) for Dr. Louanne Skye.   Procedures: No procedures performed  Lumbar Diagnostic Facet Joint Nerve Block with Fluoroscopic Guidance   Patient: Daniel Zimmerman      Date of Birth: June 23, 1960 MRN: [id number] PCP:  Lucille Passy, MD      Visit Date: 07/24/2017   Universal Protocol:    Date/Time: 03/11/191:03 PM  Consent Given By: the patient  Position: PRONE  Additional Comments: Vital signs were monitored before and after the procedure. Patient was prepped and draped in the usual sterile fashion. The correct patient, procedure, and site was verified.   Injection Procedure Details:  Procedure Site One Meds Administered:  Meds ordered this encounter  Medications  . methylPREDNISolone acetate (DEPO-MEDROL) injection 80 mg     Laterality: Bilateral  Location/Site: Bilateral L2 to L3 medial branches L3-L4  Needle size: 22 ga.  Needle type:spinal  Needle Placement: Oblique pedical  Findings:   -Comments: There was excellent flow of contrast along the articular pillars without intravascular flow.  Procedure Details: The fluoroscope beam is vertically oriented in AP and then obliqued 15 to 20 degrees to the ipsilateral side of the desired nerve to achieve the "Scotty dog" appearance.  The skin over the target area of the junction of the superior articulating process and the transverse process (sacral ala if blocking the L5 dorsal rami) was locally anesthetized with a 1 ml volume of 1% Lidocaine without Epinephrine.  The spinal needle was inserted and advanced in a trajectory view down to the target.   After contact with periosteum and negative aspirate for blood and CSF, correct placement without  intravascular or epidural spread was confirmed by injecting 0.5 ml. of Isovue-250.  A spot radiograph was obtained of this image.    Next, a 0.5 ml. volume of the injectate described above was injected. The needle was then redirected to the other facet joint nerves mentioned above if needed.  Prior to the procedure, the patient was given a Pain Diary which was completed for baseline measurements.  After the procedure, the patient rated their pain every 30 minutes and will continue rating at this  frequency for a total of 5 hours.  The patient has been asked to complete the Diary and return to [place] by mail, fax or hand delivered as soon as possible.   Additional Comments:  The patient tolerated the procedure well No complications occurred Dressing: Band-Aid    Post-procedure details: Patient was observed during the procedure. Post-procedure instructions were reviewed.  Patient left the clinic in stable condition.   Clinical History: No specialty comments available.   He reports that he quit smoking about 13 years ago. His smoking use included cigarettes. he has never used smokeless tobacco. No results for input(s): HGBA1C, LABURIC in the last 8760 hours.  Objective:  VS:  HT:    WT:   BMI:     BP:(!) 137/101  HR:98bpm  TEMP:98.3 F (36.8 C)(Oral)  RESP:94 % Physical Exam  Musculoskeletal:  Patient ambulates without aid with a normal gait.  He goes from sit to stand fairly well with some pain on extension.    Ortho Exam Imaging: Xr C-arm No Report  Result Date: 07/24/2017 Please see Notes or Procedures tab for imaging impression.   Past Medical/Family/Surgical/Social History: Medications & Allergies reviewed per EMR, new medications updated. Patient Active Problem List   Diagnosis Date Noted  . Encounter to establish care with new doctor 06/28/2017  . HLD (hyperlipidemia) 05/01/2017  . GERD (gastroesophageal reflux disease) 05/01/2017  . Prinzmetal's angina ([place]) 06/01/2015   Past Medical History:  Diagnosis Date  . Allergy   . Allergy-induced asthma    in past  . Arthritis   . Chicken pox   . Colon polyps 02/11/2014   Sessile serrated polyps x 2  . GERD (gastroesophageal reflux disease)   . Heart disease   . Hyperlipidemia   . Hypertension    Family History  Problem Relation Age of Onset  . Arthritis Mother   . Hyperlipidemia Mother   . Hypertension Mother   . Arthritis Father   . COPD Father   . Hypertension Maternal Uncle   . Arthritis  Maternal Grandmother   . Heart disease Maternal Grandmother   . Hypertension Maternal Grandmother   . Arthritis Maternal Grandfather   . Arthritis Paternal Grandmother   . Heart disease Paternal Grandmother   . Arthritis Paternal Grandfather    Past Surgical History:  Procedure Laterality Date  . ROTATOR CUFF REPAIR Right   . SPINE SURGERY     Cervical--ruptured disc  . WRIST SURGERY Left    Social History   Occupational History  . Not on file  Tobacco Use  . Smoking status: Former Smoker    Types: Cigarettes    Last attempt to quit: 10/03/2003    Years since quitting: 13.8  . Smokeless tobacco: Never Used  . Tobacco comment: quit 10 years  Substance and Sexual Activity  . Alcohol use: No    Alcohol/week: 0.0 oz    Comment: occasional  . Drug use: No  . Sexual activity: Yes

## 2017-07-24 NOTE — Patient Instructions (Signed)

## 2017-07-24 NOTE — Progress Notes (Signed)
 .  Numeric Pain Rating Scale and Functional Assessment Average Pain 8   In the last MONTH (on 0-10 scale) has pain interfered with the following?  1. General activity like being  able to carry out your everyday physical activities such as walking, climbing stairs, carrying groceries, or moving a chair?  Rating(5)   +Driver, -BT, -Dye Allergies.  

## 2017-08-10 ENCOUNTER — Encounter (INDEPENDENT_AMBULATORY_CARE_PROVIDER_SITE_OTHER): Payer: Self-pay | Admitting: Specialist

## 2017-08-18 ENCOUNTER — Other Ambulatory Visit: Payer: 59

## 2017-08-18 ENCOUNTER — Inpatient Hospital Stay: Admission: RE | Admit: 2017-08-18 | Payer: 59 | Source: Ambulatory Visit

## 2017-08-28 ENCOUNTER — Telehealth (INDEPENDENT_AMBULATORY_CARE_PROVIDER_SITE_OTHER): Payer: Self-pay | Admitting: Specialist

## 2017-08-28 NOTE — Telephone Encounter (Signed)
MED REFILL  Tramadol (Ultram) 50 mg tablet

## 2017-08-31 ENCOUNTER — Other Ambulatory Visit (INDEPENDENT_AMBULATORY_CARE_PROVIDER_SITE_OTHER): Payer: Self-pay | Admitting: Specialist

## 2017-08-31 MED ORDER — GABAPENTIN 400 MG PO CAPS: ORAL_CAPSULE | ORAL | 3 refills | Status: DC

## 2017-09-07 ENCOUNTER — Ambulatory Visit (INDEPENDENT_AMBULATORY_CARE_PROVIDER_SITE_OTHER): Payer: BLUE CROSS/BLUE SHIELD | Admitting: Specialist

## 2017-09-07 ENCOUNTER — Encounter (INDEPENDENT_AMBULATORY_CARE_PROVIDER_SITE_OTHER): Payer: Self-pay | Admitting: Specialist

## 2017-09-07 VITALS — BP 127/86 | HR 64 | Ht 70.0 in | Wt 213.0 lb

## 2017-09-07 DIAGNOSIS — M47816 Spondylosis without myelopathy or radiculopathy, lumbar region: Secondary | ICD-10-CM | POA: Diagnosis not present

## 2017-09-07 MED ORDER — HYDROCODONE-ACETAMINOPHEN 5-325 MG PO TABS: 1.0000 | ORAL_TABLET | Freq: Four times a day (QID) | ORAL | 0 refills | Status: DC | PRN

## 2017-09-07 NOTE — Progress Notes (Signed)
Office Visit Note   Patient: Daniel Zimmerman           Date of Birth: 02-26-1961           MRN: [id number] Visit Date: 09/07/2017              Requested by: Lucille Passy, MD [place], [place] [id number] PCP: Lucille Passy, MD   Assessment & Plan: Visit Diagnoses:  1. Spondylosis without myelopathy or radiculopathy, lumbar region     Plan: Avoid bending, stooping and avoid lifting weights greater than 10 lbs. Avoid prolong standing and walking. Avoid frequent bending and stooping  No lifting greater than 10 lbs. May use ice or moist heat for pain. Weight loss is of benefit. Handicap license is approved. Dr. Romona Curls secretary/Assistant will call to arrange for further joint blocks and consideration of RFA of the Bilateral L2 and L3 medial branches. Had 1nearly 100% relief for 10 days with blocks.   Follow-Up Instructions: Return in about 6 weeks (around 10/19/2017).   Orders:  No orders of the defined types were placed in this encounter.  No orders of the defined types were placed in this encounter.     Procedures: No procedures performed   Clinical Data: No additional findings.   Subjective: Chief Complaint  Patient presents with  . Lower Back - Follow-up    57 year old male with history of lumbar spondylosis, underwent lumbar medial branch blocks L2 and L3 by Dr. Ernestina Patches 07/24/2017, The injection worked with about 10 days with 100% pain relief, or very minor discomfort.   Review of Systems  Constitutional: Negative.   HENT: Negative.   Eyes: Negative.   Respiratory: Negative.   Cardiovascular: Negative.   Gastrointestinal: Negative.   Endocrine: Negative.   Genitourinary: Negative.   Musculoskeletal: Negative.   Skin: Negative.   Allergic/Immunologic: Negative.   Neurological: Negative.   Hematological: Negative.   Psychiatric/Behavioral: Negative.      Objective: Vital Signs: BP 127/86   Pulse 64   Ht 5\' 10"  (1.778 m)   Wt  213 lb (96.6 kg)   BMI 30.56 kg/m   Physical Exam  Constitutional: He is oriented to person, place, and time. He appears well-developed and well-nourished.  HENT:  Head: Normocephalic and atraumatic.  Eyes: Pupils are equal, round, and reactive to light. EOM are normal.  Neck: Normal range of motion. Neck supple.  Pulmonary/Chest: Effort normal and breath sounds normal.  Abdominal: Soft. Bowel sounds are normal.  Neurological: He is alert and oriented to person, place, and time.  Skin: Skin is warm and dry.  Psychiatric: He has a normal mood and affect. His behavior is normal. Judgment and thought content normal.    Back Exam   Tenderness  The patient is experiencing tenderness in the lumbar.  Range of Motion  Extension: abnormal  Flexion: normal  Lateral bend right: normal  Lateral bend left: normal  Rotation right: normal  Rotation left: normal   Muscle Strength  Right Quadriceps:  5/5  Left Quadriceps:  5/5  Right Hamstrings:  5/5  Left Hamstrings:  5/5   Tests  Straight leg raise right: negative Straight leg raise left: negative  Reflexes  Patellar: normal Achilles: normal Biceps: normal Babinski's sign: normal   Other  Toe walk: normal Heel walk: normal Sensation: normal Gait: normal  Erythema: no back redness Scars: absent      Specialty Comments:  No specialty comments available.  Imaging: No results  found.   PMFS History: Patient Active Problem List   Diagnosis Date Noted  . Encounter to establish care with new doctor 06/28/2017  . HLD (hyperlipidemia) 05/01/2017  . GERD (gastroesophageal reflux disease) 05/01/2017  . Prinzmetal's angina ([place]) 06/01/2015   Past Medical History:  Diagnosis Date  . Allergy   . Allergy-induced asthma    in past  . Arthritis   . Chicken pox   . Colon polyps 02/11/2014   Sessile serrated polyps x 2  . GERD (gastroesophageal reflux disease)   . Heart disease   . Hyperlipidemia   . Hypertension      Family History  Problem Relation Age of Onset  . Arthritis Mother   . Hyperlipidemia Mother   . Hypertension Mother   . Arthritis Father   . COPD Father   . Hypertension Maternal Uncle   . Arthritis Maternal Grandmother   . Heart disease Maternal Grandmother   . Hypertension Maternal Grandmother   . Arthritis Maternal Grandfather   . Arthritis Paternal Grandmother   . Heart disease Paternal Grandmother   . Arthritis Paternal Grandfather     Past Surgical History:  Procedure Laterality Date  . ROTATOR CUFF REPAIR Right   . SPINE SURGERY     Cervical--ruptured disc  . WRIST SURGERY Left    Social History   Occupational History  . Not on file  Tobacco Use  . Smoking status: Former Smoker    Types: Cigarettes    Last attempt to quit: 10/03/2003    Years since quitting: 13.9  . Smokeless tobacco: Never Used  . Tobacco comment: quit 10 years  Substance and Sexual Activity  . Alcohol use: No    Alcohol/week: 0.0 oz    Comment: occasional  . Drug use: No  . Sexual activity: Yes

## 2017-09-07 NOTE — Patient Instructions (Signed)
Avoid bending, stooping and avoid lifting weights greater than 10 lbs. Avoid prolong standing and walking. Avoid frequent bending and stooping  No lifting greater than 10 lbs. May use ice or moist heat for pain. Weight loss is of benefit. Handicap license is approved. Dr. Romona Curls secretary/Assistant will call to arrange for further joint blocks and consideration of RFA of the Bilateral L2 and L3 medial branches. Had 1nearly 100% relief for 10 days with blocks.

## 2017-09-11 ENCOUNTER — Telehealth (INDEPENDENT_AMBULATORY_CARE_PROVIDER_SITE_OTHER): Payer: Self-pay | Admitting: *Deleted

## 2017-09-11 ENCOUNTER — Telehealth (INDEPENDENT_AMBULATORY_CARE_PROVIDER_SITE_OTHER): Payer: Self-pay | Admitting: Orthopedic Surgery

## 2017-09-11 NOTE — Telephone Encounter (Signed)
Patient called back and requested for you to give him a call.  Thank you.

## 2017-09-11 NOTE — Telephone Encounter (Signed)
I called and advised pt that we sent the referral to [facility] for PT and that he can call to schedule appt

## 2017-09-12 NOTE — Telephone Encounter (Signed)
I called and per patient they had not rec'd the order for PT.  I refaxed info to [phone]

## 2017-09-13 ENCOUNTER — Telehealth (INDEPENDENT_AMBULATORY_CARE_PROVIDER_SITE_OTHER): Payer: Self-pay | Admitting: Radiology

## 2017-09-13 NOTE — Telephone Encounter (Signed)
Lattie Haw with [facility] is calling she states that they need an updated order for PT for this patient.  The order can be faxed to her @ [phone]

## 2017-09-25 ENCOUNTER — Telehealth (INDEPENDENT_AMBULATORY_CARE_PROVIDER_SITE_OTHER): Payer: Self-pay | Admitting: Specialist

## 2017-09-25 ENCOUNTER — Ambulatory Visit (INDEPENDENT_AMBULATORY_CARE_PROVIDER_SITE_OTHER): Payer: BLUE CROSS/BLUE SHIELD

## 2017-09-25 ENCOUNTER — Encounter (INDEPENDENT_AMBULATORY_CARE_PROVIDER_SITE_OTHER): Payer: Self-pay | Admitting: Physical Medicine and Rehabilitation

## 2017-09-25 ENCOUNTER — Ambulatory Visit (INDEPENDENT_AMBULATORY_CARE_PROVIDER_SITE_OTHER): Payer: BLUE CROSS/BLUE SHIELD | Admitting: Physical Medicine and Rehabilitation

## 2017-09-25 VITALS — BP 125/86 | HR 58

## 2017-09-25 DIAGNOSIS — M47816 Spondylosis without myelopathy or radiculopathy, lumbar region: Secondary | ICD-10-CM

## 2017-09-25 MED ORDER — BUPIVACAINE HCL 0.5 % IJ SOLN: 3.0000 mL | Freq: Once | INTRAMUSCULAR | Status: DC

## 2017-09-25 MED ORDER — METHYLPREDNISOLONE ACETATE 80 MG/ML IJ SUSP: 80.0000 mg | Freq: Once | INTRAMUSCULAR | Status: DC

## 2017-09-25 NOTE — Procedures (Signed)
Lumbar Diagnostic Facet Joint Nerve Block with Fluoroscopic Guidance   Patient: Daniel Zimmerman      Date of Birth: 04/20/1961 MRN: [id number] PCP: Lucille Passy, MD      Visit Date: 09/25/2017   Universal Protocol:    Date/Time: 09/25/1909:54 AM  Consent Given By: the patient  Position: PRONE  Additional Comments: Vital signs were monitored before and after the procedure. Patient was prepped and draped in the usual sterile fashion. The correct patient, procedure, and site was verified.   Injection Procedure Details:  Procedure Site One Meds Administered:  Meds ordered this encounter  Medications  . methylPREDNISolone acetate (DEPO-MEDROL) injection 80 mg  . bupivacaine (MARCAINE) 0.5 % (with pres) injection 3 mL     Laterality: Bilateral  Location/Site: Bilateral L3 and L4 medial branches.  The prior first diagnostic block was performed at the same level but was documented as the level above.  Fluoroscopic images does show prior injection done at the L3 and L4 medial branches. L4-L5  Needle size: 22 ga.  Needle type:spinal  Needle Placement: Oblique pedical  Findings:   -Comments: There was excellent flow of contrast along the articular pillars without intravascular flow.  Procedure Details: The fluoroscope beam is vertically oriented in AP and then obliqued 15 to 20 degrees to the ipsilateral side of the desired nerve to achieve the "Scotty dog" appearance.  The skin over the target area of the junction of the superior articulating process and the transverse process (sacral ala if blocking the L5 dorsal rami) was locally anesthetized with a 1 ml volume of 1% Lidocaine without Epinephrine.  The spinal needle was inserted and advanced in a trajectory view down to the target.   After contact with periosteum and negative aspirate for blood and CSF, correct placement without intravascular or epidural spread was confirmed by injecting 0.5 ml. of Isovue-250.  A spot radiograph  was obtained of this image.    Next, a 0.5 ml. volume of the injectate described above was injected. The needle was then redirected to the other facet joint nerves mentioned above if needed.  Prior to the procedure, the patient was given a Pain Diary which was completed for baseline measurements.  After the procedure, the patient rated their pain every 30 minutes and will continue rating at this frequency for a total of 5 hours.  The patient has been asked to complete the Diary and return to [place] by mail, fax or hand delivered as soon as possible.   Additional Comments:  The patient tolerated the procedure well Dressing: Band-Aid    Post-procedure details: Patient was observed during the procedure. Post-procedure instructions were reviewed.  Patient left the clinic in stable condition.

## 2017-09-25 NOTE — Progress Notes (Signed)
Daniel Zimmerman - 57 y.o. male MRN [id number]  Date of birth: 05/17/1960  Office Visit Note: Visit Date: 09/25/2017 PCP: Lucille Passy, MD Referred by: Lucille Passy, MD  Subjective: Chief Complaint  Patient presents with  . Lower Back - Pain  . Right Leg - Pain  . Left Leg - Pain   HPI: Daniel Zimmerman is a 57 year old gentleman who is followed by Dr. Basil Dess in our office from an orthopedic spine surgery standpoint.  He has had MRI findings of very mild facet arthropathy but very severe axial low back pain.  Pain is centered at the L3-4 L4-5 region.  He has mostly pain with standing and extension.  He really has difficulty sleeping at night and has very tight musculature.  He is taking currently small amount of opioid medication along with gabapentin.  He has no radicular pain.  He is regrouping with physical therapy and has had some of this in the past.  His symptoms have been ongoing for several months now.  Nothing is really helped at all and he rates his pain is pretty severe and limiting particularly without getting much sleep at night.  He rates his pain as a 9 out of 10.  Prior diagnostic medial branch blocks gave him almost 100% relief for 10 days.  Looking back at the fluoroscopic images we did complete the injection based on the patient's symptoms.  MRI findings really show equal level of pathology which is mild.  By point of fact looking at the prior fluoroscopic images we did complete medial branch blocks to block the L4-5 facet joints bilaterally.  We will repeat this today.  Going forward he would get try to get authorization for radiofrequency ablation if he gets good relief with documentation on the pain diary.   ROS Otherwise per HPI.  Assessment & Plan: Visit Diagnoses:  1. Spondylosis without myelopathy or radiculopathy, lumbar region     Plan: No additional findings.   Meds & Orders:  Meds ordered this encounter  Medications  . methylPREDNISolone acetate (DEPO-MEDROL)  injection 80 mg  . bupivacaine (MARCAINE) 0.5 % (with pres) injection 3 mL    Orders Placed This Encounter  Procedures  . Facet Injection  . XR C-ARM NO REPORT    Follow-up: Return if symptoms worsen or fail to improve, for Pain Dirary review.   Procedures: No procedures performed  Lumbar Diagnostic Facet Joint Nerve Block with Fluoroscopic Guidance   Patient: Daniel Zimmerman      Date of Birth: 11-04-1960 MRN: [id number] PCP: Lucille Passy, MD      Visit Date: 09/25/2017   Universal Protocol:    Date/Time: 09/25/1909:54 AM  Consent Given By: the patient  Position: PRONE  Additional Comments: Vital signs were monitored before and after the procedure. Patient was prepped and draped in the usual sterile fashion. The correct patient, procedure, and site was verified.   Injection Procedure Details:  Procedure Site One Meds Administered:  Meds ordered this encounter  Medications  . methylPREDNISolone acetate (DEPO-MEDROL) injection 80 mg  . bupivacaine (MARCAINE) 0.5 % (with pres) injection 3 mL     Laterality: Bilateral  Location/Site: Bilateral L3 and L4 medial branches.  The prior first diagnostic block was performed at the same level but was documented as the level above.  Fluoroscopic images does show prior injection done at the L3 and L4 medial branches. L4-L5  Needle size: 22 ga.  Needle type:spinal  Needle Placement: Oblique pedical  Findings:   -Comments: There was excellent flow of contrast along the articular pillars without intravascular flow.  Procedure Details: The fluoroscope beam is vertically oriented in AP and then obliqued 15 to 20 degrees to the ipsilateral side of the desired nerve to achieve the "Scotty dog" appearance.  The skin over the target area of the junction of the superior articulating process and the transverse process (sacral ala if blocking the L5 dorsal rami) was locally anesthetized with a 1 ml volume of 1% Lidocaine without  Epinephrine.  The spinal needle was inserted and advanced in a trajectory view down to the target.   After contact with periosteum and negative aspirate for blood and CSF, correct placement without intravascular or epidural spread was confirmed by injecting 0.5 ml. of Isovue-250.  A spot radiograph was obtained of this image.    Next, a 0.5 ml. volume of the injectate described above was injected. The needle was then redirected to the other facet joint nerves mentioned above if needed.  Prior to the procedure, the patient was given a Pain Diary which was completed for baseline measurements.  After the procedure, the patient rated their pain every 30 minutes and will continue rating at this frequency for a total of 5 hours.  The patient has been asked to complete the Diary and return to [place] by mail, fax or hand delivered as soon as possible.   Additional Comments:  The patient tolerated the procedure well Dressing: Band-Aid    Post-procedure details: Patient was observed during the procedure. Post-procedure instructions were reviewed.  Patient left the clinic in stable condition.   Clinical History: No specialty comments available.   He reports that he quit smoking about 13 years ago. His smoking use included cigarettes. He has never used smokeless tobacco. No results for input(s): HGBA1C, LABURIC in the last 8760 hours.  Objective:  VS:  HT:    WT:   BMI:     BP:125/86  HR:(!) 58bpm  TEMP: ( )  RESP:  Physical Exam  Ortho Exam Imaging: Xr C-arm No Report  Result Date: 09/25/2017 Please see Notes or Procedures tab for imaging impression.   Past Medical/Family/Surgical/Social History: Medications & Allergies reviewed per EMR, new medications updated. Patient Active Problem List   Diagnosis Date Noted  . Encounter to establish care with new doctor 06/28/2017  . HLD (hyperlipidemia) 05/01/2017  . GERD (gastroesophageal reflux disease) 05/01/2017  . Prinzmetal's angina ([place])  06/01/2015   Past Medical History:  Diagnosis Date  . Allergy   . Allergy-induced asthma    in past  . Arthritis   . Chicken pox   . Colon polyps 02/11/2014   Sessile serrated polyps x 2  . GERD (gastroesophageal reflux disease)   . Heart disease   . Hyperlipidemia   . Hypertension    Family History  Problem Relation Age of Onset  . Arthritis Mother   . Hyperlipidemia Mother   . Hypertension Mother   . Arthritis Father   . COPD Father   . Hypertension Maternal Uncle   . Arthritis Maternal Grandmother   . Heart disease Maternal Grandmother   . Hypertension Maternal Grandmother   . Arthritis Maternal Grandfather   . Arthritis Paternal Grandmother   . Heart disease Paternal Grandmother   . Arthritis Paternal Grandfather    Past Surgical History:  Procedure Laterality Date  . ROTATOR CUFF REPAIR Right   . SPINE SURGERY     Cervical--ruptured disc  . WRIST SURGERY Left  Social History   Occupational History  . Not on file  Tobacco Use  . Smoking status: Former Smoker    Types: Cigarettes    Last attempt to quit: 10/03/2003    Years since quitting: 13.9  . Smokeless tobacco: Never Used  . Tobacco comment: quit 10 years  Substance and Sexual Activity  . Alcohol use: No    Alcohol/week: 0.0 oz    Comment: occasional  . Drug use: No  . Sexual activity: Yes

## 2017-09-25 NOTE — Telephone Encounter (Signed)
Patient came in stating that the PT facility he was referred to can't get him in for a few months and is needing it sent somewhere else. CB # [phone]

## 2017-09-25 NOTE — Progress Notes (Signed)
Numeric Pain Rating Scale and Functional Assessment Average Pain 9   In the last MONTH (on 0-10 scale) has pain interfered with the following?  1. General activity like being  able to carry out your everyday physical activities such as walking, climbing stairs, carrying groceries, or moving a chair?  Rating(7)   +Driver, -BT, -Dye Allergies. 

## 2017-09-25 NOTE — Telephone Encounter (Signed)
Put [facility].

## 2017-09-25 NOTE — Patient Instructions (Signed)

## 2017-10-13 ENCOUNTER — Telehealth (INDEPENDENT_AMBULATORY_CARE_PROVIDER_SITE_OTHER): Payer: Self-pay | Admitting: *Deleted

## 2017-12-01 NOTE — Telephone Encounter (Signed)
error 

## 2018-03-21 ENCOUNTER — Ambulatory Visit: Payer: BLUE CROSS/BLUE SHIELD | Admitting: Family Medicine

## 2018-03-21 ENCOUNTER — Encounter: Payer: Self-pay | Admitting: Family Medicine

## 2018-03-21 VITALS — BP 126/80 | HR 77 | Temp 98.3°F | Ht 70.0 in | Wt 220.5 lb

## 2018-03-21 DIAGNOSIS — J020 Streptococcal pharyngitis: Secondary | ICD-10-CM | POA: Diagnosis not present

## 2018-03-21 LAB — POCT RAPID STREP A (OFFICE): Rapid Strep A Screen: NEGATIVE

## 2018-03-21 MED ORDER — CEPHALEXIN 500 MG PO CAPS: 500.0000 mg | ORAL_CAPSULE | Freq: Three times a day (TID) | ORAL | 0 refills | Status: AC

## 2018-03-21 NOTE — Progress Notes (Signed)
Subjective:  Patient ID: Daniel Zimmerman, male    DOB: 1961-02-09  Age: 57 y.o. MRN: [id number]  CC: sore throat glands   HPI Daniel Zimmerman presents for 1 to 2-day history of sore throat with headache and nausea.  Daniel Zimmerman has ongoing postnasal drip associated with rhinitis.  Daniel Zimmerman has had no cough.  Daniel Zimmerman is afebrile today.   Outpatient Medications Prior to Visit  Medication Sig Dispense Refill  . aspirin 81 MG chewable tablet Chew by mouth daily.    Marland Kitchen atorvastatin (LIPITOR) 20 MG tablet Take 20 mg by mouth daily.  3  . Calcium Polycarbophil (FIBER-CAPS PO) Take 1 capsule by mouth 2 (two) times daily.    . cetirizine (ZYRTEC) 10 MG tablet Take 10 mg by mouth daily.    Marland Kitchen diltiazem (DILTIAZEM CD) 180 MG 24 hr capsule Take 180 mg by mouth daily.    . famotidine (PEPCID) 20 MG tablet Take 20 mg by mouth 2 (two) times daily.    Marland Kitchen gabapentin (NEURONTIN) 400 MG capsule Take 2 capsules po at bedtime and 1 capsule po in the AM. 270 capsule 3  . HYDROcodone-acetaminophen (NORCO/VICODIN) 5-325 MG tablet Take 1 tablet by mouth every 6 (six) hours as needed for moderate pain. 30 tablet 0  . L-Arginine 1000 MG TABS Take by mouth.    . nitroGLYCERIN (NITROSTAT) 0.3 MG SL tablet Place 0.3 mg under the tongue every 5 (five) minutes as needed for chest pain.    . traMADol (ULTRAM) 50 MG tablet Take 1 tablet (50 mg total) by mouth every 6 (six) hours as needed. 50 tablet 0  . Turmeric 500 MG CAPS Take by mouth.     Facility-Administered Medications Prior to Visit  Medication Dose Route Frequency Provider Last Rate Last Dose  . bupivacaine (MARCAINE) 0.5 % (with pres) injection 3 mL  3 mL Other Once Magnus Sinning, MD      . methylPREDNISolone acetate (DEPO-MEDROL) injection 80 mg  80 mg Other Once Magnus Sinning, MD        ROS Review of Systems  Constitutional: Negative for chills, diaphoresis, fatigue, fever and unexpected weight change.  HENT: Positive for postnasal drip and sore throat. Negative for  congestion, rhinorrhea, sinus pressure, sinus pain and trouble swallowing.   Eyes: Negative for photophobia and visual disturbance.  Respiratory: Negative for cough.   Cardiovascular: Negative.   Gastrointestinal: Positive for nausea. Negative for abdominal pain and vomiting.  Musculoskeletal: Negative for arthralgias and myalgias.  Skin: Negative.   Psychiatric/Behavioral: Negative.     Objective:  BP 126/80 (BP Location: Left Arm, Patient Position: Sitting, Cuff Size: Normal)   Pulse 77   Temp 98.3 F (36.8 C) (Oral)   Ht 5\' 10"  (1.778 m)   Wt 220 lb 8 oz (100 kg)   SpO2 99%   BMI 31.64 kg/m   BP Readings from Last 3 Encounters:  03/21/18 126/80  09/25/17 125/86  09/07/17 127/86    Wt Readings from Last 3 Encounters:  03/21/18 220 lb 8 oz (100 kg)  09/07/17 213 lb (96.6 kg)  07/06/17 213 lb (96.6 kg)    Physical Exam  Constitutional: Daniel Zimmerman is oriented to person, place, and time. Daniel Zimmerman appears well-developed and well-nourished. No distress.  HENT:  Head: Normocephalic and atraumatic.  Right Ear: External ear normal.  Left Ear: External ear normal.  Mouth/Throat: Oropharynx is clear and moist. No oropharyngeal exudate.  Eyes: Pupils are equal, round, and reactive to light. Conjunctivae and EOM are  normal. Left eye exhibits no discharge. No scleral icterus.  Neck: Neck supple.  Cardiovascular: Normal rate, regular rhythm and normal heart sounds.  Pulmonary/Chest: Effort normal and breath sounds normal.  Lymphadenopathy:    Daniel Zimmerman has cervical adenopathy ([place]).  Neurological: Daniel Zimmerman is alert and oriented to person, place, and time.  Skin: Skin is warm and dry. Daniel Zimmerman is not diaphoretic.  Psychiatric: Daniel Zimmerman has a normal mood and affect. Daniel Zimmerman behavior is normal.    Lab Results  Component Value Date   WBC 6.0 05/01/2017   HGB 15.5 05/01/2017   HCT 46.4 05/01/2017   PLT 225.0 05/01/2017   GLUCOSE 86 05/01/2017   CHOL 113 05/01/2017   TRIG 97.0 05/01/2017   HDL 39.10 05/01/2017    LDLCALC 54 05/01/2017   ALT 45 05/01/2017   AST 23 05/01/2017   NA 140 05/01/2017   K 4.4 05/01/2017   CL 105 05/01/2017   CREATININE 0.93 05/01/2017   BUN 11 05/01/2017   CO2 30 05/01/2017   PSA 0.95 05/01/2017    Mr Lumbar Spine W Wo Contrast  Result Date: 05/20/2017 CLINICAL DATA:  Weakness and numbness for 3-4 weeks. No prior surgery. EXAM: MRI LUMBAR SPINE WITHOUT AND WITH CONTRAST TECHNIQUE: Multiplanar and multiecho pulse sequences of the lumbar spine were obtained without and with intravenous contrast. CONTRAST:  20 mL MultiHance COMPARISON:  None. FINDINGS: Segmentation:  Standard. Alignment:  Physiologic. Vertebrae: No fracture, evidence of discitis, or aggressive bone lesion. Conus medullaris and cauda equina: Conus extends to the L1 level. Conus and cauda equina appear normal. Paraspinal and other soft tissues: No paraspinal abnormality. Disc levels: Disc spaces: Degenerative disc disease with disc height loss at T12-L1. Disc desiccation at L1-2. T12-L1: No significant disc bulge. No evidence of neural foraminal stenosis. No central canal stenosis. L1-L2: Mild broad-based disc bulge. No evidence of neural foraminal stenosis. No central canal stenosis. L2-L3: No significant disc bulge. No evidence of neural foraminal stenosis. No central canal stenosis. L3-L4: No significant disc bulge. No evidence of neural foraminal stenosis. No central canal stenosis. L4-L5: No significant disc bulge. No evidence of neural foraminal stenosis. No central canal stenosis. L5-S1: No significant disc bulge. No evidence of neural foraminal stenosis. No central canal stenosis. IMPRESSION: 1. No significant lumbar spine disc protrusion, foraminal stenosis or central canal stenosis. 2.  No acute osseous injury of the lumbar spine. Electronically Signed   By: Kathreen Devoid   On: 05/20/2017 11:47    Assessment & Plan:   Daniel Zimmerman was seen today for sore throat glands.  Diagnoses and all orders for this  visit:  Strep pharyngitis -     POC Rapid Strep A -     cephALEXin (KEFLEX) 500 MG capsule; Take 1 capsule (500 mg total) by mouth 3 (three) times daily for 10 days.   I am having Daniel Zimmerman start on cephALEXin. I am also having Daniel Zimmerman maintain Daniel Zimmerman diltiazem, cetirizine, famotidine, nitroGLYCERIN, atorvastatin, Calcium Polycarbophil (FIBER-CAPS PO), aspirin, Turmeric, L-Arginine, traMADol, gabapentin, and HYDROcodone-acetaminophen. We will continue to administer methylPREDNISolone acetate and bupivacaine.  Meds ordered this encounter  Medications  . cephALEXin (KEFLEX) 500 MG capsule    Sig: Take 1 capsule (500 mg total) by mouth 3 (three) times daily for 10 days.    Dispense:  30 capsule    Refill:  0   Patient positive for strep by criteria.  Daniel Zimmerman had a penicillin allergy as an infant.  Daniel Zimmerman cannot take macrolides secondary to interactions with diltiazem and  atorvastatin.  We will try Keflex 3 times daily and Daniel Zimmerman will let us know if Daniel Zimmerman develops any problems on that medication.  Follow-up: No follow-ups on file.  Libby Maw, MD

## 2018-03-21 NOTE — Patient Instructions (Signed)
Strep Throat Strep throat is a bacterial infection of the throat. Your health care provider may call the infection tonsillitis or pharyngitis, depending on whether there is swelling in the tonsils or at the back of the throat. Strep throat is most common during the cold months of the year in children who are 5-56 years of age, but it can happen during any season in people of any age. This infection is spread from person to person (contagious) through coughing, sneezing, or close contact. What are the causes? Strep throat is caused by the bacteria called Streptococcus pyogenes. What increases the risk? This condition is more likely to develop in:  People who spend time in crowded places where the infection can spread easily.  People who have close contact with someone who has strep throat.  What are the signs or symptoms? Symptoms of this condition include:  Fever or chills.  Redness, swelling, or pain in the tonsils or throat.  Pain or difficulty when swallowing.  White or yellow spots on the tonsils or throat.  Swollen, tender glands in the neck or under the jaw.  Red rash all over the body (rare).  How is this diagnosed? This condition is diagnosed by performing a rapid strep test or by taking a swab of your throat (throat culture test). Results from a rapid strep test are usually ready in a few minutes, but throat culture test results are available after one or two days. How is this treated? This condition is treated with antibiotic medicine. Follow these instructions at home: Medicines  Take over-the-counter and prescription medicines only as told by your health care provider.  Take your antibiotic as told by your health care provider. Do not stop taking the antibiotic even if you start to feel better.  Have family members who also have a sore throat or fever tested for strep throat. They may need antibiotics if they have the strep infection. Eating and drinking  Do not  share food, drinking cups, or personal items that could cause the infection to spread to other people.  If swallowing is difficult, try eating soft foods until your sore throat feels better.  Drink enough fluid to keep your urine clear or pale yellow. General instructions  Gargle with a salt-water mixture 3-4 times per day or as needed. To make a salt-water mixture, completely dissolve -1 tsp of salt in 1 cup of warm water.  Make sure that all household members wash their hands well.  Get plenty of rest.  Stay home from school or work until you have been taking antibiotics for 24 hours.  Keep all follow-up visits as told by your health care provider. This is important. Contact a health care provider if:  The glands in your neck continue to get bigger.  You develop a rash, cough, or earache.  You cough up a thick liquid that is green, yellow-brown, or bloody.  You have pain or discomfort that does not get better with medicine.  Your problems seem to be getting worse rather than better.  You have a fever. Get help right away if:  You have new symptoms, such as vomiting, severe headache, stiff or painful neck, chest pain, or shortness of breath.  You have severe throat pain, drooling, or changes in your voice.  You have swelling of the neck, or the skin on the neck becomes red and tender.  You have signs of dehydration, such as fatigue, dry mouth, and decreased urination.  You become increasingly sleepy, or   you cannot wake up completely.  Your joints become red or painful. This information is not intended to replace advice given to you by your health care provider. Make sure you discuss any questions you have with your health care provider. Document Released: 04/29/2000 Document Revised: 12/30/2015 Document Reviewed: 08/25/2014 Elsevier Interactive Patient Education  2018 [employer]  

## 2018-04-30 ENCOUNTER — Encounter: Payer: BLUE CROSS/BLUE SHIELD | Admitting: Family Medicine

## 2018-04-30 IMAGING — CT CT ABD-PELV W/ CM
2 of 5 series · 15 of 46 positions shown, 17 images · IV contrast (ISOVUE 300)
Comparison: Prior CT from 05/08/2015.

CLINICAL DATA: Initial evaluation for abdominal distension with
lower back pain and abdominal pain. History of diverticulosis.

EXAM:
CT ABDOMEN AND PELVIS WITH CONTRAST
TECHNIQUE: Multidetector CT imaging of the abdomen and pelvis was performed
using the standard protocol following bolus administration of
intravenous contrast.
CONTRAST:  100mL Z8W3JP-BPP IOPAMIDOL (Z8W3JP-BPP) INJECTION 61%

[Series 2: abd/pel w · axial · 0.84mm/px · z∈[-516,-41]mm · 12 of 107 slices shown, 14 images]
[im 6/107  soft-tissue]
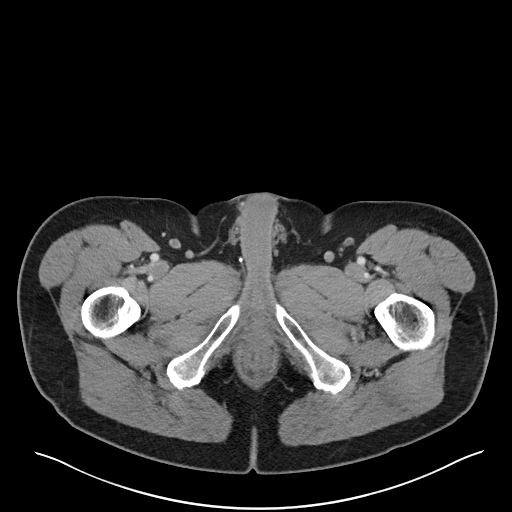
[im 6/107  bone]
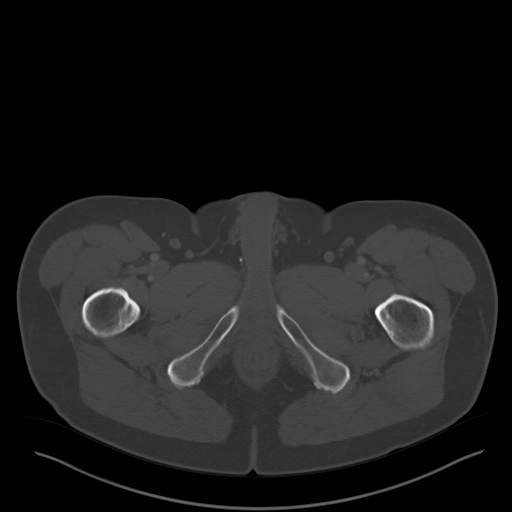
[im 17/107  soft-tissue]
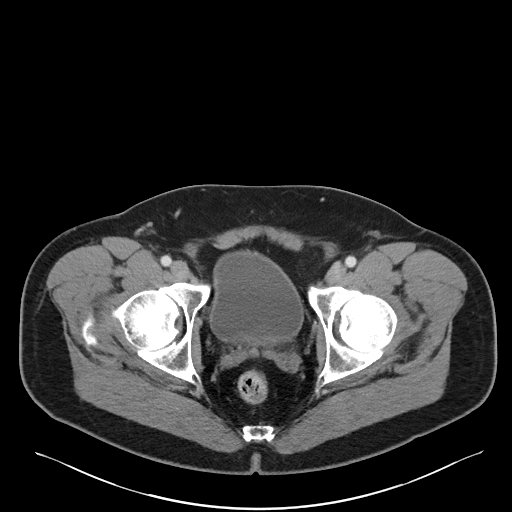
[im 23/107  soft-tissue]
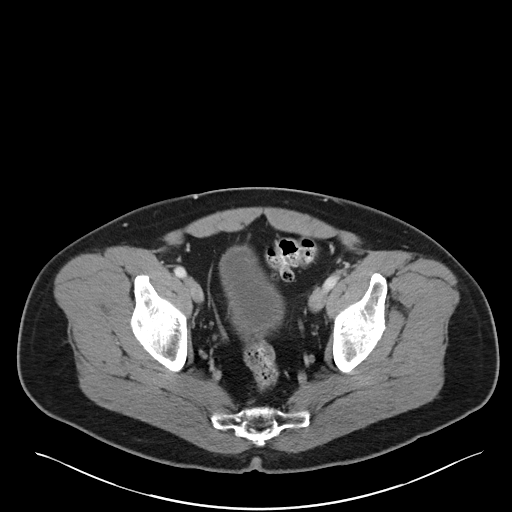
[im 34/107  soft-tissue]
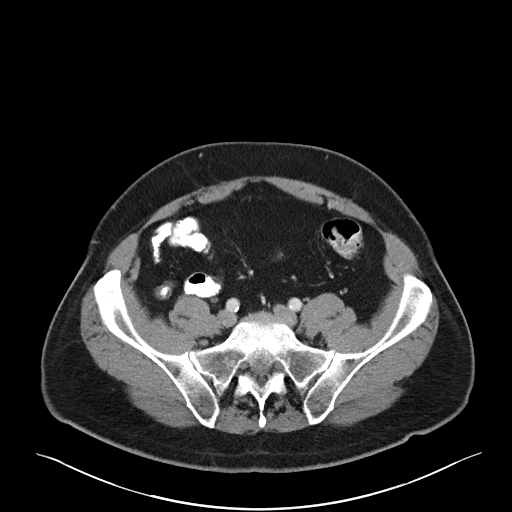
[im 40/107  soft-tissue]
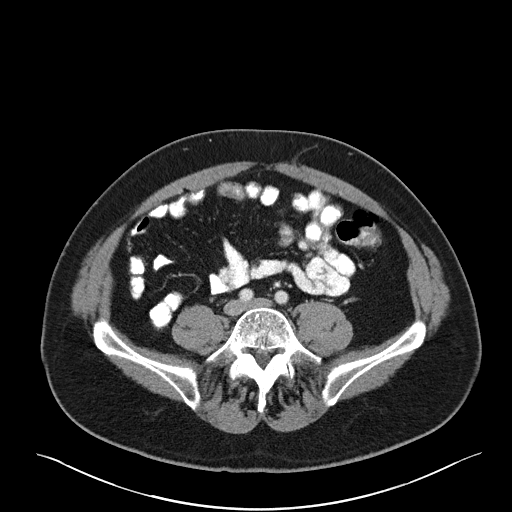
[im 51/107  soft-tissue]
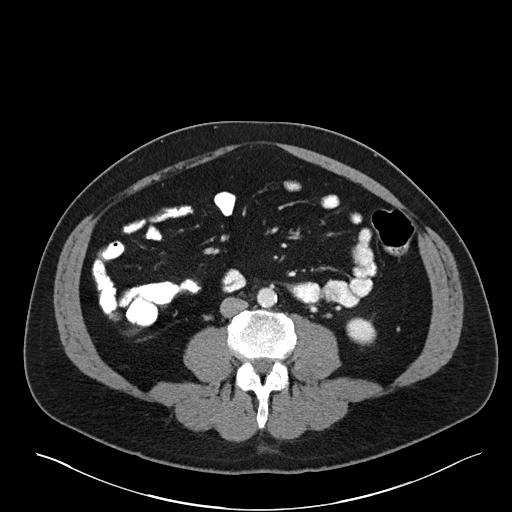
[im 56/107  soft-tissue]
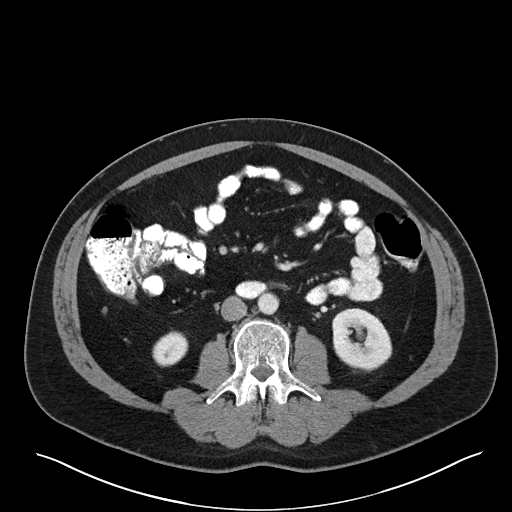
[im 67/107  soft-tissue]
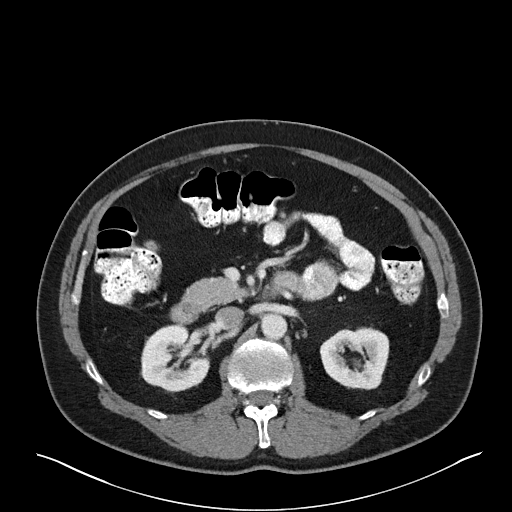
[im 73/107  soft-tissue]
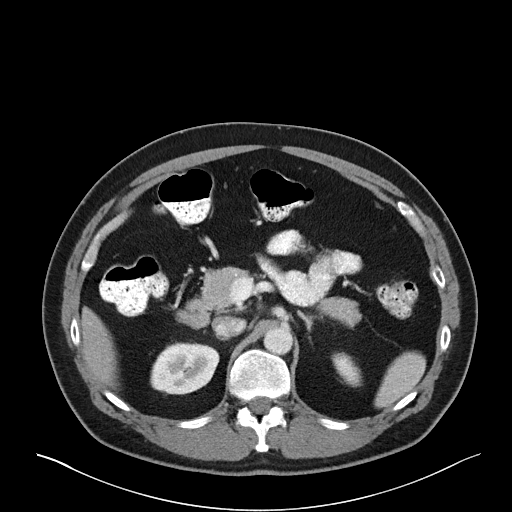
[im 73/107  bone]
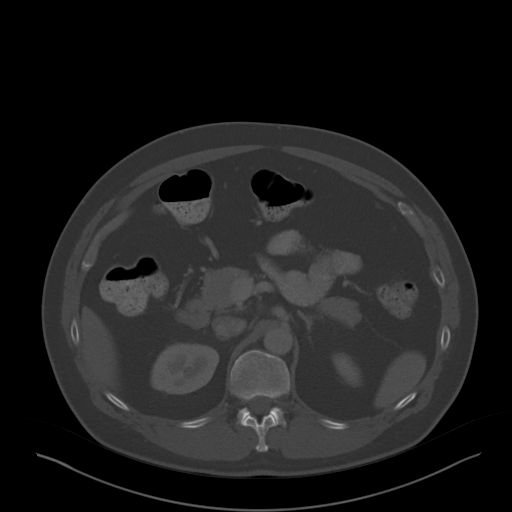
[im 84/107  soft-tissue]
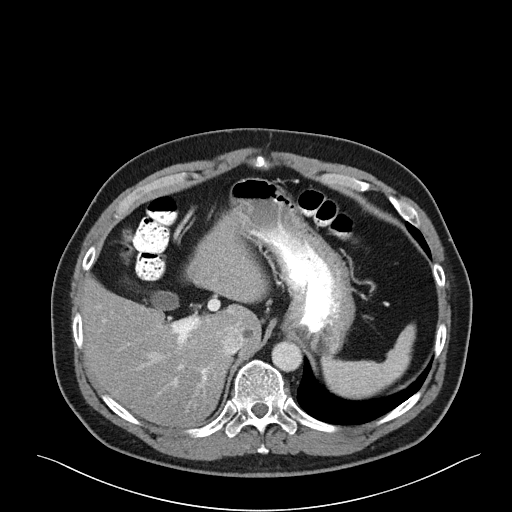
[im 90/107  soft-tissue]
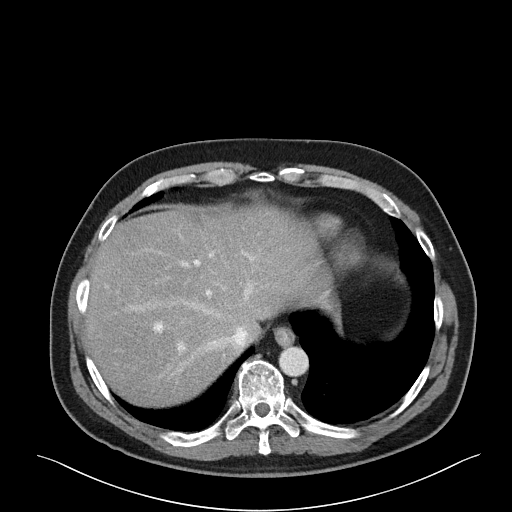
[im 101/107  soft-tissue]
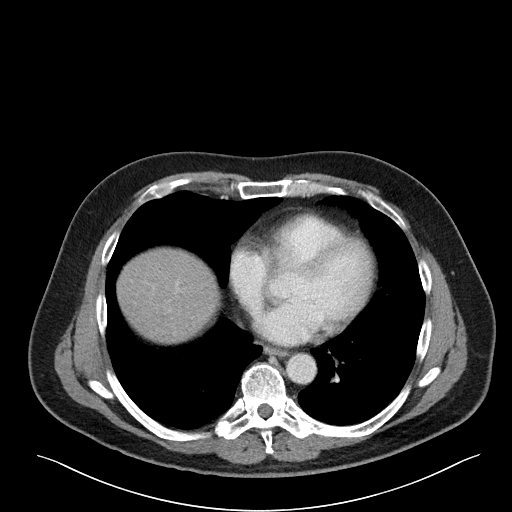

[Series 6: abd/pel w st · coronal · 0.75mm/px · 3 of 98 slices shown]
[im 33/98  soft-tissue]
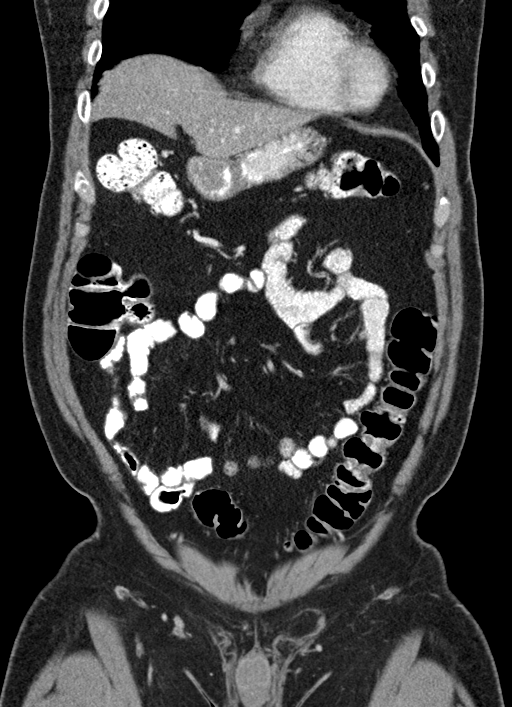
[im 44/98  soft-tissue]
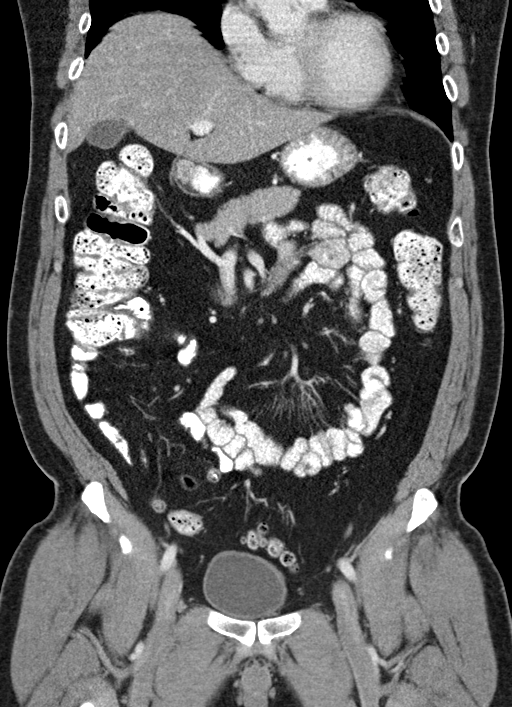
[im 54/98  soft-tissue]
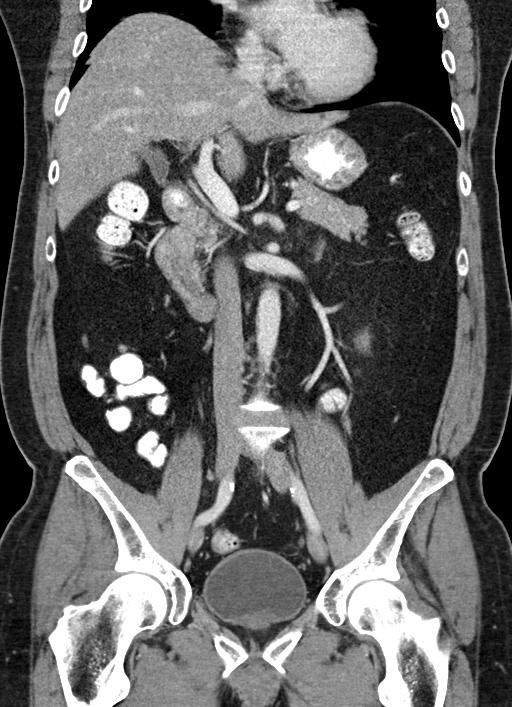

[15 of 46 positions shown; findings below may reference images not displayed]

FINDINGS: Lower chest: Hazy subsegmental atelectatic changes present within
the visualized lung bases. Visualized lungs are otherwise clear.

Hepatobiliary: Diffuse hypoattenuation of the liver consistent with
steatosis. 9 mm hypodense lesion present at the caudate lobe, too
small the characterize, but likely a small cyst, similar to
previous. Additional subcentimeter hypodensity at the hepatic dome
noted, also too small the characterize and grossly stable. Liver
otherwise unremarkable. Gallbladder within normal limits. No biliary
dilatation.

Pancreas: There is a subtle 5 mm hypodense cystic lesion at the
pancreatic tail (series 2, image 36). This is not definitely seen on
previous study. Pancreas otherwise unremarkable.

Spleen: Spleen within normal limits.

Adrenals/Urinary Tract: Adrenal normal. Kidneys equal size with
symmetric enhancement. Few small subcentimeter hypodensities noted
within the right kidney, too small the characterize, but
statistically likely reflects small cyst. The no nephrolithiasis,
hydronephrosis, or focal enhancing renal mass. No hydroureter.
Bladder within normal limits.

Stomach/Bowel: Stomach within normal limits. No evidence for bowel
obstruction. Appendix normal. Colonic diverticulosis without
evidence for acute diverticulitis. No abnormal wall thickening,
mucosal enhancement, or inflammatory fat stranding seen about the
bowels.

Vascular/Lymphatic: Normal intravascular enhancement seen throughout
the intra-abdominal aorta and its branch vessels. Mild aorto
bi-iliac atherosclerotic disease. No aneurysm. No pathologically
enlarged intra-abdominal or pelvic lymph nodes.

Reproductive: Prostate mildly prominent with median lobe
hypertrophy.

Other: Small fat containing left inguinal and paraumbilical hernias
noted without associated inflammation. No free air or fluid.

Musculoskeletal: No acute osseus abnormality. No worrisome lytic or
blastic osseous lesions.
IMPRESSION: 1. No acute intra-abdominal or pelvic process identified.
2. Colonic diverticulosis without evidence for acute diverticulitis.
3. Hepatic steatosis.
4. Small fact containing paraumbilical and left inguinal hernias.
5. **An incidental finding of potential clinical significance has
been found. Subtle 5 mm cystic lesion positioned at the pancreatic
tail, indeterminate. Recommend follow up pre and post contrast
MRI/MRCP or pancreatic protocol CT in 1 year. This recommendation
follows ACR consensus guidelines: Management of Incidental
Pancreatic Cysts: A White Paper of the ACR Incidental Findings
Committee. [HOSPITAL] [id number];[DATE].**

## 2018-05-01 DIAGNOSIS — Z0000 Encounter for general adult medical examination without abnormal findings: Secondary | ICD-10-CM | POA: Insufficient documentation

## 2018-05-02 ENCOUNTER — Telehealth: Payer: Self-pay

## 2018-05-02 ENCOUNTER — Encounter: Payer: Self-pay | Admitting: Family Medicine

## 2018-05-02 ENCOUNTER — Ambulatory Visit (INDEPENDENT_AMBULATORY_CARE_PROVIDER_SITE_OTHER): Payer: BLUE CROSS/BLUE SHIELD | Admitting: Family Medicine

## 2018-05-02 VITALS — BP 134/84 | HR 64 | Temp 99.0°F | Ht 69.5 in | Wt 219.6 lb

## 2018-05-02 DIAGNOSIS — K420 Umbilical hernia with obstruction, without gangrene: Secondary | ICD-10-CM

## 2018-05-02 DIAGNOSIS — K219 Gastro-esophageal reflux disease without esophagitis: Secondary | ICD-10-CM

## 2018-05-02 DIAGNOSIS — Z125 Encounter for screening for malignant neoplasm of prostate: Secondary | ICD-10-CM | POA: Diagnosis not present

## 2018-05-02 DIAGNOSIS — M5416 Radiculopathy, lumbar region: Secondary | ICD-10-CM | POA: Insufficient documentation

## 2018-05-02 DIAGNOSIS — I201 Angina pectoris with documented spasm: Secondary | ICD-10-CM

## 2018-05-02 DIAGNOSIS — E7800 Pure hypercholesterolemia, unspecified: Secondary | ICD-10-CM | POA: Diagnosis not present

## 2018-05-02 DIAGNOSIS — K429 Umbilical hernia without obstruction or gangrene: Secondary | ICD-10-CM

## 2018-05-02 DIAGNOSIS — R5383 Other fatigue: Secondary | ICD-10-CM | POA: Diagnosis not present

## 2018-05-02 DIAGNOSIS — R399 Unspecified symptoms and signs involving the genitourinary system: Secondary | ICD-10-CM | POA: Diagnosis not present

## 2018-05-02 DIAGNOSIS — Z0000 Encounter for general adult medical examination without abnormal findings: Secondary | ICD-10-CM

## 2018-05-02 DIAGNOSIS — G4700 Insomnia, unspecified: Secondary | ICD-10-CM | POA: Insufficient documentation

## 2018-05-02 LAB — COMPREHENSIVE METABOLIC PANEL
ALBUMIN: 4.3 g/dL (ref 3.5–5.2)
ALT: 53 U/L (ref 0–53)
AST: 29 U/L (ref 0–37)
Alkaline Phosphatase: 82 U/L (ref 39–117)
BUN: 10 mg/dL (ref 6–23)
CALCIUM: 9.1 mg/dL (ref 8.4–10.5)
CO2: 30 meq/L (ref 19–32)
CREATININE: 0.89 mg/dL (ref 0.40–1.50)
Chloride: 105 mEq/L (ref 96–112)
GFR: 93.88 mL/min (ref >60.00)
Glucose, Bld: 105 mg/dL — ABNORMAL HIGH (ref 70–99)
Potassium: 4.6 mEq/L (ref 3.5–5.1)
Sodium: 141 mEq/L (ref 135–145)
Total Bilirubin: 0.6 mg/dL (ref 0.2–1.2)
Total Protein: 6.7 g/dL (ref 6.0–8.3)

## 2018-05-02 LAB — PSA: PSA: 0.87 ng/mL (ref 0.10–4.00)

## 2018-05-02 LAB — CBC WITH DIFFERENTIAL/PLATELET
BASOS ABS: 0.1 10*3/uL (ref 0.0–0.1)
Basophils Relative: 1.5 % (ref 0.0–3.0)
EOS ABS: 0.6 10*3/uL (ref 0.0–0.7)
Eosinophils Relative: 10.1 % — ABNORMAL HIGH (ref 0.0–5.0)
HEMATOCRIT: 47.5 % (ref 39.0–52.0)
Hemoglobin: 16.1 g/dL (ref 13.0–17.0)
LYMPHS PCT: 25.5 % (ref 12.0–46.0)
Lymphs Abs: 1.5 10*3/uL (ref 0.7–4.0)
MCHC: 34 g/dL (ref 30.0–36.0)
MCV: 88.8 fl (ref 78.0–100.0)
Monocytes Absolute: 0.7 10*3/uL (ref 0.1–1.0)
Monocytes Relative: 11.1 % (ref 3.0–12.0)
NEUTROS ABS: 3.1 10*3/uL (ref 1.4–7.7)
Neutrophils Relative %: 51.8 % (ref 43.0–77.0)
PLATELETS: 231 10*3/uL (ref 150.0–400.0)
RBC: 5.35 Mil/uL (ref 4.22–5.81)
RDW: 13.7 % (ref 11.5–15.5)
WBC: 6 10*3/uL (ref 4.0–10.5)

## 2018-05-02 LAB — LIPID PANEL
CHOL/HDL RATIO: 4
CHOLESTEROL: 137 mg/dL (ref 0–200)
HDL: 37.2 mg/dL — ABNORMAL LOW (ref >39.00)
LDL Cholesterol: 80 mg/dL (ref 0–99)
NonHDL: 100.05
TRIGLYCERIDES: 99 mg/dL (ref 0.0–149.0)
VLDL: 19.8 mg/dL (ref 0.0–40.0)

## 2018-05-02 LAB — TESTOSTERONE: Testosterone: 242.04 ng/dL — ABNORMAL LOW (ref 300.00–890.00)

## 2018-05-02 LAB — TSH: TSH: 0.8 u[IU]/mL (ref 0.35–4.50)

## 2018-05-02 LAB — VITAMIN D 25 HYDROXY (VIT D DEFICIENCY, FRACTURES): VITD: 50.67 ng/mL (ref 30.00–100.00)

## 2018-05-02 MED ORDER — PREGABALIN 75 MG PO CAPS: 75.0000 mg | ORAL_CAPSULE | Freq: Two times a day (BID) | ORAL | 2 refills | Status: DC

## 2018-05-02 NOTE — Assessment & Plan Note (Signed)
Reviewed preventive care protocols, scheduled due services, and updated immunizations Discussed nutrition, exercise, diet, and healthy lifestyle.  

## 2018-05-02 NOTE — Patient Instructions (Addendum)
Great to see you. I will call you with your lab results from today and you can view them online.   We are referring you to a urologist and a general surgeon.  We are starting Lyrica 75 mg twice daily.  Please update me in a few weeks.  Please tell Dr. Einar Gip that I said hello.  Say hi to lovely family.  Happy Holidays!

## 2018-05-02 NOTE — Assessment & Plan Note (Signed)
Now with symptoms concerning for incarceration, progressing over the past 2-3 months. Refer urgently to general surgery. The patient indicates understanding of these issues and agrees with the plan.

## 2018-05-02 NOTE — Telephone Encounter (Signed)
TA-Plz see message below/[facility] called about referral for Dx of Incarcerated Umbilical Hernia?States that would need to go to ED for evaluation as they cannot see him for that/plz advise/thx dmf

## 2018-05-02 NOTE — Assessment & Plan Note (Signed)
Likely due to difficulty sleeping from pain but will check additional labs to rule out other contributing factors. The patient indicates understanding of these issues and agrees with the plan.

## 2018-05-02 NOTE — Progress Notes (Signed)
Subjective:   Patient ID: Daniel Zimmerman, male    DOB: [birth year], 57 y.o.   MRN: [id number]  Daniel Zimmerman is a pleasant 57 y.o. year old male who presents to clinic today with Annual Exam (Patient is here today for a CPE.  He is currently fasting.  His immunizations are UTD.  His last Colonoscopy was by [facility] on 1.20.17 and is to repeat in 2022 due to H/O sessile serrated polyps.)  on 05/02/2018  HPI:  CPX- established care with me on 06/28/17.  Note reviewed.   Umbilical hernia- noticed it a few years ago, getting larger and now painful for past couple of months.  And past two month is no longer reducible.  HLD- has been well controlled with lipitor. Lab Results  Component Value Date   CHOL 113 05/01/2017   HDL 39.10 05/01/2017   LDLCALC 54 05/01/2017   TRIG 97.0 05/01/2017   CHOLHDL 3 05/01/2017   GERD: No issues on Pepcid. He denies breakthrough symptoms.  H/o Prinzmetal's angina- followed by cardiology, Dr. Nadyne Coombes.  He is on dilatizem and has prn NTG that he rarely uses.  Has noticed more fatigue.  Enlarged prostate- was told last year he has a very large prostate by his orthopedist seen on back imaging. Increased difficulty starting and stopping stream, waking up more at night.  Lab Results  Component Value Date   PSA 0.95 05/01/2017   PSA 0.90 11/24/2014   Fatigue- feels it is coming from insomnia. Insomnia due to severe DDD with radiculopathy.    Health Maintenance  Topic Date Due  . TETANUS/TDAP  06/28/2018 (Originally 01/15/1980)  . COLONOSCOPY  06/04/2020  . INFLUENZA VACCINE  Completed  . Hepatitis C Screening  Completed  . HIV Screening  Completed   Current Outpatient Medications on File Prior to Visit  Medication Sig Dispense Refill  . aspirin 81 MG chewable tablet Chew by mouth daily.    Marland Kitchen atorvastatin (LIPITOR) 20 MG tablet Take 20 mg by mouth daily.  3  . cetirizine (ZYRTEC) 10 MG tablet Take 10 mg by mouth daily.    Marland Kitchen diltiazem  (DILTIAZEM CD) 180 MG 24 hr capsule Take 180 mg by mouth daily.    . famotidine (PEPCID) 20 MG tablet Take 20 mg by mouth 2 (two) times daily.    Marland Kitchen gabapentin (NEURONTIN) 400 MG capsule Take 2 capsules po at bedtime and 1 capsule po in the AM. 270 capsule 3  . L-Arginine 1000 MG TABS Take by mouth.    . nitroGLYCERIN (NITROSTAT) 0.3 MG SL tablet Place 0.3 mg under the tongue every 5 (five) minutes as needed for chest pain.     No current facility-administered medications on file prior to visit.     Allergies  Allergen Reactions  . Celebrex [Celecoxib] Swelling  . Penicillins     Was a baby when he had reaction--unknown    Past Medical History:  Diagnosis Date  . Allergy   . Allergy-induced asthma    in past  . Arthritis   . Chicken pox   . Colon polyps 02/11/2014   Sessile serrated polyps x 2  . GERD (gastroesophageal reflux disease)   . Heart disease   . Hyperlipidemia   . Hypertension     Past Surgical History:  Procedure Laterality Date  . ROTATOR CUFF REPAIR Right   . SPINE SURGERY     Cervical--ruptured disc  . WRIST SURGERY Left     Family History  Problem Relation Age of Onset  . Arthritis Mother   . Hyperlipidemia Mother   . Hypertension Mother   . Arthritis Father   . COPD Father   . Hypertension Maternal Uncle   . Arthritis Maternal Grandmother   . Heart disease Maternal Grandmother   . Hypertension Maternal Grandmother   . Arthritis Maternal Grandfather   . Arthritis Paternal Grandmother   . Heart disease Paternal Grandmother   . Arthritis Paternal Grandfather     Social History   Socioeconomic History  . Marital status: Married    Spouse name: Not on file  . Number of children: Not on file  . Years of education: Not on file  . Highest education level: Not on file  Occupational History  . Not on file  Social Needs  . Financial resource strain: Not on file  . Food insecurity:    Worry: Not on file    Inability: Not on file  .  Transportation needs:    Medical: Not on file    Non-medical: Not on file  Tobacco Use  . Smoking status: Former Smoker    Types: Cigarettes    Last attempt to quit: 10/03/2003    Years since quitting: 14.5  . Smokeless tobacco: Never Used  . Tobacco comment: quit 10 years  Substance and Sexual Activity  . Alcohol use: No    Alcohol/week: 0.0 standard drinks    Comment: occasional  . Drug use: No  . Sexual activity: Yes  Lifestyle  . Physical activity:    Days per week: Not on file    Minutes per session: Not on file  . Stress: Not on file  Relationships  . Social connections:    Talks on phone: Not on file    Gets together: Not on file    Attends religious service: Not on file    Active member of club or organization: Not on file    Attends meetings of clubs or organizations: Not on file    Relationship status: Not on file  . Intimate partner violence:    Fear of current or ex partner: Not on file    Emotionally abused: Not on file    Physically abused: Not on file    Forced sexual activity: Not on file  Other Topics Concern  . Not on file  Social History Narrative  . Not on file   The PMH, PSH, Social History, Family History, Medications, and allergies have been reviewed in [facility], and have been updated if relevant.  Review of Systems  Constitutional: Positive for fatigue.  HENT: Negative.   Eyes: Negative.   Respiratory: Negative.   Cardiovascular: Negative.   Gastrointestinal: Positive for abdominal pain. Negative for anal bleeding, blood in stool, constipation, diarrhea and nausea.  Endocrine: Negative.   Genitourinary: Positive for difficulty urinating, frequency and urgency.  Musculoskeletal: Positive for back pain.  Skin: Negative.   Allergic/Immunologic: Negative.   Neurological: Positive for numbness.  Hematological: Negative.   Psychiatric/Behavioral: Positive for sleep disturbance.  All other systems reviewed and are negative.      Objective:      BP 134/84 (BP Location: Left Arm, Patient Position: Sitting, Cuff Size: Normal)   Pulse 64   Temp 99 F (37.2 C) (Oral)   Ht 5' 9.5" (1.765 m)   Wt 219 lb 9.6 oz (99.6 kg)   SpO2 95%   BMI 31.96 kg/m    Physical Exam   General:  pleasant male in no acute distress  Eyes:  PERRL Ears:  External ear exam shows no significant lesions or deformities.  TMs normal bilaterally Hearing is grossly normal bilaterally. Nose:  External nasal examination shows no deformity or inflammation. Nasal mucosa are pink and moist without lesions or exudates. Mouth:  Oral mucosa and oropharynx without lesions or exudates.  Teeth in good repair. Neck:  no carotid bruit or thyromegaly no cervical or supraclavicular lymphadenopathy  Lungs:  Normal respiratory effort, chest expands symmetrically. Lungs are clear to auscultation, no crackles or wheezes. Heart:  Normal rate and regular rhythm. S1 and S2 normal without gallop, murmur, click, rub or other extra sounds. Abdomen:  + umbilical hernia, non reducible due to pain, area around it is very painful to palpation +guarding Pulses:  R and L posterior tibial pulses are full and equal bilaterally  Extremities:  no edema  Psych:  Good eye contact, not anxious or depressed appearing        Assessment & Plan:   Visit for well man health check - Plan: Comp Met (CMET), CBC w/Diff, Lipid Profile, PSA, TSH, VITAMIN D 25 Hydroxy (Vit-D Deficiency, Fractures), Testosterone, Testosterone, free  Pure hypercholesterolemia - Plan: Comp Met (CMET), Lipid Profile, TSH, VITAMIN D 25 Hydroxy (Vit-D Deficiency, Fractures)  Screening for malignant neoplasm of prostate - Plan: PSA  Gastroesophageal reflux disease without esophagitis - Plan: CBC w/Diff, Lipid Profile  Decreased energy - Plan: Testosterone, Testosterone, free  Prinzmetal's angina (HCC)  Other fatigue  Umbilical hernia without obstruction and without gangrene  Incarcerated umbilical hernia - Plan:  Ambulatory referral to General Surgery  Lower urinary tract symptoms (LUTS) - Plan: Ambulatory referral to Urology  Insomnia, unspecified type  Radiculopathy of lumbar region No follow-ups on file.

## 2018-05-02 NOTE — Telephone Encounter (Signed)
Copied from [place] [id number]. Topic: Referral - Question >> May 02, 2018  2:43 PM Vernona Rieger wrote: Reason for CRM: [facility] with [facility] called and stated they received a referral for Incarcerated umbilical hernia. She said that if that is what he is needing to be seen for that they can not see him in the office for that. He would need to go to the emergency room to be evaluated. Contact number is [phone]

## 2018-05-02 NOTE — Assessment & Plan Note (Signed)
PSA today and refer to urology. The patient indicates understanding of these issues and agrees with the plan.

## 2018-05-02 NOTE — Assessment & Plan Note (Signed)
Followed by Dr. Nadyne Coombes.

## 2018-05-02 NOTE — Assessment & Plan Note (Signed)
Deteriorated and keeping him up at night. Insurance will no longer pay for epidural injections and per pt, was told that surgery will not improve his symptoms. Discussed tx options and he would like to try Lyrica. eRx sent for Lyrica 75 mg twice daily.  He will update me in a few weeks.

## 2018-05-03 NOTE — Telephone Encounter (Addendum)
Sorry.  The diagnosis should have been symptomatic umbilical hernia or irreducible umbilical hernia- concerned for becoming incarcerated.  He has had symptoms for several weeks now.  I have placed new referral with that diagnosis.

## 2018-05-03 NOTE — Addendum Note (Signed)
Addended by: Lucille Passy on: 05/03/2018 02:35 PM   Modules accepted: Orders

## 2018-05-07 LAB — TESTOSTERONE, FREE: TESTOSTERONE FREE: 37.1 pg/mL — ABNORMAL LOW (ref 46.0–224.0)

## 2018-05-15 ENCOUNTER — Encounter: Payer: Self-pay | Admitting: Family Medicine

## 2018-05-15 ENCOUNTER — Ambulatory Visit: Payer: BLUE CROSS/BLUE SHIELD | Admitting: Family Medicine

## 2018-05-15 VITALS — BP 112/78 | HR 79 | Temp 98.3°F | Ht 70.0 in | Wt 222.6 lb

## 2018-05-15 DIAGNOSIS — J069 Acute upper respiratory infection, unspecified: Secondary | ICD-10-CM

## 2018-05-15 MED ORDER — AZITHROMYCIN 250 MG PO TABS: ORAL_TABLET | ORAL | 0 refills | Status: DC

## 2018-05-15 MED ORDER — PREDNISONE 10 MG PO TABS: ORAL_TABLET | ORAL | 0 refills | Status: DC

## 2018-05-15 MED ORDER — ALBUTEROL SULFATE HFA 108 (90 BASE) MCG/ACT IN AERS: 2.0000 | INHALATION_SPRAY | Freq: Four times a day (QID) | RESPIRATORY_TRACT | 0 refills | Status: DC | PRN

## 2018-05-15 NOTE — Patient Instructions (Signed)
Great to see you. Take zpack as directed. Prednisone dose pack in the morning and with food as directed.  Albuterol as needed for cough and wheezing.  Happy New Year. Please keep me updated.

## 2018-05-15 NOTE — Assessment & Plan Note (Signed)
>  25 minutes spent in face to face time with patient, >50% spent in counselling or coordination of care Coughing, wheezing, congestion for almost two weeks. Lyrica can cause bronchitis/resp adverse rxn in a small percentage of people.  We discussed weaning off of it alhtough he feels it is helping and this could certainly just be an upper respiratory infection- initially viral, now given progression of symptoms, bacterial. We agreed to treat for bronchitis/bacterial infection- zpack, prednisone and albuterol sent to pharmacy on file and to continue Lyrica for now.  In fact, he had planned on asking for an increased dose of Lyrica when he called in his next refill. If symptoms do not improve with above treatment, we will try to wean off lyrica. The patient indicates understanding of these issues and agrees with the plan.

## 2018-05-15 NOTE — Progress Notes (Signed)
Subjective:   Patient ID: Daniel Zimmerman, male    DOB: 14-Sep-1960, 57 y.o.   MRN: [id number]  Daniel Zimmerman is a pleasant 57 y.o. year old male who presents to clinic today with Cough (Patient is here today C/O coughing.  He also states that he has had some wheezing. Sx started a couple weeks ago.  Cough is intermittently productive.  Had to use wifes inhaler this morning.  Rattling wheezing in chest.  Had asthma as a child and it feels like that.  Is wondering if it could be possible that he is having a reaction to the Lyrica as it was started the same time.)  on 05/15/2018  HPI:  URI-  Two or more weeks of progressive cough, wheezing, congestion. Had asthma as a child and this feels like that.  He used his wife's inhaler this morning and now is breathing better than he has in two weeks.  He denies fevers, chest pain, or SOB.  Cough is intermittently productive.  He does question if this could be due to Lyrica as he started Lyrica around the time that his symptoms started.  Current Outpatient Medications on File Prior to Visit  Medication Sig Dispense Refill  . aspirin 81 MG chewable tablet Chew by mouth daily.    Marland Kitchen atorvastatin (LIPITOR) 20 MG tablet Take 20 mg by mouth daily.  3  . cetirizine (ZYRTEC) 10 MG tablet Take 10 mg by mouth daily.    Marland Kitchen diltiazem (DILTIAZEM CD) 180 MG 24 hr capsule Take 180 mg by mouth daily.    . famotidine (PEPCID) 20 MG tablet Take 20 mg by mouth 2 (two) times daily.    Marland Kitchen gabapentin (NEURONTIN) 400 MG capsule Take 2 capsules po at bedtime and 1 capsule po in the AM. 270 capsule 3  . L-Arginine 1000 MG TABS Take by mouth.    . nitroGLYCERIN (NITROSTAT) 0.3 MG SL tablet Place 0.3 mg under the tongue every 5 (five) minutes as needed for chest pain.    . pregabalin (LYRICA) 75 MG capsule Take 1 capsule (75 mg total) by mouth 2 (two) times daily. 60 capsule 2   No current facility-administered medications on file prior to visit.     Allergies    Allergen Reactions  . Celebrex [Celecoxib] Swelling  . Penicillins     Was a baby when he had reaction--unknown    Past Medical History:  Diagnosis Date  . Allergy   . Allergy-induced asthma    in past  . Arthritis   . Chicken pox   . Colon polyps 02/11/2014   Sessile serrated polyps x 2  . GERD (gastroesophageal reflux disease)   . Heart disease   . Hyperlipidemia   . Hypertension     Past Surgical History:  Procedure Laterality Date  . ROTATOR CUFF REPAIR Right   . SPINE SURGERY     Cervical--ruptured disc  . WRIST SURGERY Left     Family History  Problem Relation Age of Onset  . Arthritis Mother   . Hyperlipidemia Mother   . Hypertension Mother   . Arthritis Father   . COPD Father   . Hypertension Maternal Uncle   . Arthritis Maternal Grandmother   . Heart disease Maternal Grandmother   . Hypertension Maternal Grandmother   . Arthritis Maternal Grandfather   . Arthritis Paternal Grandmother   . Heart disease Paternal Grandmother   . Arthritis Paternal Grandfather     Social History   Socioeconomic  History  . Marital status: Married    Spouse name: Not on file  . Number of children: Not on file  . Years of education: Not on file  . Highest education level: Not on file  Occupational History  . Not on file  Social Needs  . Financial resource strain: Not on file  . Food insecurity:    Worry: Not on file    Inability: Not on file  . Transportation needs:    Medical: Not on file    Non-medical: Not on file  Tobacco Use  . Smoking status: Former Smoker    Types: Cigarettes    Last attempt to quit: 10/03/2003    Years since quitting: 14.6  . Smokeless tobacco: Never Used  . Tobacco comment: quit 10 years  Substance and Sexual Activity  . Alcohol use: No    Alcohol/week: 0.0 standard drinks    Comment: occasional  . Drug use: No  . Sexual activity: Yes  Lifestyle  . Physical activity:    Days per week: Not on file    Minutes per session: Not  on file  . Stress: Not on file  Relationships  . Social connections:    Talks on phone: Not on file    Gets together: Not on file    Attends religious service: Not on file    Active member of club or organization: Not on file    Attends meetings of clubs or organizations: Not on file    Relationship status: Not on file  . Intimate partner violence:    Fear of current or ex partner: Not on file    Emotionally abused: Not on file    Physically abused: Not on file    Forced sexual activity: Not on file  Other Topics Concern  . Not on file  Social History Narrative  . Not on file   The PMH, PSH, Social History, Family History, Medications, and allergies have been reviewed in [facility], and have been updated if relevant.   Review of Systems  Constitutional: Negative for fatigue and fever.  HENT: Positive for congestion. Negative for dental problem, drooling, ear discharge, ear pain, facial swelling, hearing loss, mouth sores, nosebleeds, postnasal drip, sinus pressure, sinus pain, trouble swallowing and voice change.   Respiratory: Positive for cough, shortness of breath and wheezing. Negative for apnea, choking, chest tightness and stridor.   Cardiovascular: Negative.   Gastrointestinal: Negative.   Genitourinary: Negative.   Musculoskeletal: Negative.   Neurological: Negative.   Hematological: Negative.   Psychiatric/Behavioral: Negative.   All other systems reviewed and are negative.      Objective:    BP 112/78 (BP Location: Left Arm, Patient Position: Sitting, Cuff Size: Normal)   Pulse 79   Temp 98.3 F (36.8 C) (Oral)   Ht 5\' 10"  (1.778 m)   Wt 222 lb 9.6 oz (101 kg)   SpO2 94%   BMI 31.94 kg/m    Physical Exam Vitals signs and nursing note reviewed.  Constitutional:      General: He is not in acute distress.    Appearance: Normal appearance. He is not ill-appearing.  HENT:     Head: Normocephalic and atraumatic.     Right Ear: Tympanic membrane normal.     Left  Ear: Tympanic membrane normal.     Nose: Congestion present.     Mouth/Throat:     Mouth: Mucous membranes are moist.  Eyes:     Extraocular Movements: Extraocular movements intact.  Cardiovascular:     Rate and Rhythm: Normal rate and regular rhythm.     Pulses: Normal pulses.     Heart sounds: Normal heart sounds.  Pulmonary:     Breath sounds: Wheezing present.  Musculoskeletal: Normal range of motion.        General: No swelling.  Skin:    General: Skin is warm and dry.  Neurological:     General: No focal deficit present.     Mental Status: He is alert and oriented to person, place, and time.  Psychiatric:        Mood and Affect: Mood normal.        Behavior: Behavior normal.        Thought Content: Thought content normal.        Judgment: Judgment normal.           Assessment & Plan:   Upper respiratory tract infection, unspecified type No follow-ups on file.

## 2018-05-21 DIAGNOSIS — K429 Umbilical hernia without obstruction or gangrene: Secondary | ICD-10-CM | POA: Diagnosis not present

## 2018-06-06 ENCOUNTER — Encounter: Payer: Self-pay | Admitting: Family Medicine

## 2018-06-07 ENCOUNTER — Encounter: Payer: Self-pay | Admitting: Family Medicine

## 2018-06-07 DIAGNOSIS — I10 Essential (primary) hypertension: Secondary | ICD-10-CM | POA: Diagnosis not present

## 2018-06-07 DIAGNOSIS — E7800 Pure hypercholesterolemia, unspecified: Secondary | ICD-10-CM | POA: Diagnosis not present

## 2018-06-07 DIAGNOSIS — R0789 Other chest pain: Secondary | ICD-10-CM | POA: Diagnosis not present

## 2018-06-07 DIAGNOSIS — Z0189 Encounter for other specified special examinations: Secondary | ICD-10-CM | POA: Diagnosis not present

## 2018-06-11 DIAGNOSIS — Z125 Encounter for screening for malignant neoplasm of prostate: Secondary | ICD-10-CM | POA: Diagnosis not present

## 2018-06-11 DIAGNOSIS — R3915 Urgency of urination: Secondary | ICD-10-CM | POA: Diagnosis not present

## 2018-06-11 DIAGNOSIS — E349 Endocrine disorder, unspecified: Secondary | ICD-10-CM | POA: Diagnosis not present

## 2018-06-13 ENCOUNTER — Encounter: Payer: Self-pay | Admitting: Family Medicine

## 2018-06-19 DIAGNOSIS — K429 Umbilical hernia without obstruction or gangrene: Secondary | ICD-10-CM | POA: Diagnosis not present

## 2018-07-01 ENCOUNTER — Other Ambulatory Visit: Payer: Self-pay | Admitting: Cardiology

## 2018-07-18 ENCOUNTER — Other Ambulatory Visit: Payer: Self-pay

## 2018-07-18 MED ORDER — DILTIAZEM HCL ER COATED BEADS 180 MG PO CP24: 180.0000 mg | ORAL_CAPSULE | Freq: Every day | ORAL | 1 refills | Status: DC

## 2018-07-18 MED ORDER — FAMOTIDINE 20 MG PO TABS: 20.0000 mg | ORAL_TABLET | Freq: Two times a day (BID) | ORAL | 1 refills | Status: DC

## 2018-07-18 MED ORDER — METOPROLOL SUCCINATE ER 25 MG PO TB24: 25.0000 mg | ORAL_TABLET | Freq: Every day | ORAL | 1 refills | Status: DC

## 2018-08-23 ENCOUNTER — Encounter: Payer: Self-pay | Admitting: Family Medicine

## 2018-08-23 ENCOUNTER — Ambulatory Visit (INDEPENDENT_AMBULATORY_CARE_PROVIDER_SITE_OTHER): Payer: BLUE CROSS/BLUE SHIELD | Admitting: Family Medicine

## 2018-08-23 VITALS — BP 134/84 | HR 65 | Temp 97.8°F | Ht 70.0 in

## 2018-08-23 DIAGNOSIS — J069 Acute upper respiratory infection, unspecified: Secondary | ICD-10-CM | POA: Diagnosis not present

## 2018-08-23 MED ORDER — ALBUTEROL SULFATE HFA 108 (90 BASE) MCG/ACT IN AERS: 2.0000 | INHALATION_SPRAY | Freq: Four times a day (QID) | RESPIRATORY_TRACT | 0 refills | Status: DC | PRN

## 2018-08-23 MED ORDER — PREDNISONE 10 MG PO TABS: ORAL_TABLET | ORAL | 0 refills | Status: DC

## 2018-08-23 MED ORDER — DOXYCYCLINE HYCLATE 100 MG PO TABS: 100.0000 mg | ORAL_TABLET | Freq: Two times a day (BID) | ORAL | 0 refills | Status: DC

## 2018-08-23 NOTE — Progress Notes (Signed)
Virtual Visit via Video   I connected with Daniel Zimmerman on 08/23/18 at 11:20 AM EDT by a video enabled telemedicine application and verified that I am speaking with the correct person using two identifiers. Location patient: Home Location provider: [facility], Office Persons participating in the virtual visit: Aubery Lapping, MD   I discussed the limitations of evaluation and management by telemedicine and the availability of in person appointments. The patient expressed understanding and agreed to proceed.  Subjective:   HPI:  Upper respiratory symptoms-  Sinus pressure, runny nose, tightness in chest for one day. What is most concerning to him is the tightness in his chest despite using his inhaler.  Has felt feverish.  No cough. No SOB. Having some swollen lymph nodes in his chest.  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    Review of Systems  Constitutional: Positive for chills, fatigue and fever. Negative for activity change, appetite change and diaphoresis.  HENT: Positive for congestion, ear pain, rhinorrhea, sinus pressure, sinus pain and sneezing. Negative for sore throat, tinnitus and trouble swallowing.   Respiratory: Positive for chest tightness and wheezing. Negative for cough, choking, shortness of breath and stridor.   Cardiovascular: Negative for chest pain.  All other systems reviewed and are negative.   ROS: See pertinent positives and negatives per HPI.  Patient Active Problem List   Diagnosis Date Noted  . Upper respiratory infection 05/15/2018  . Fatigue 05/02/2018  . Insomnia 05/02/2018  . Radiculopathy of lumbar region 05/02/2018  . Irreducible umbilical hernia 24/23/5361  . Lower urinary tract symptoms (LUTS) 05/02/2018  . Visit for well man health check 05/01/2018  . HLD (hyperlipidemia) 05/01/2017  . GERD (gastroesophageal reflux disease) 05/01/2017  .  Prinzmetal's angina ([place]) 06/01/2015    Social History   Tobacco Use  . Smoking status: Former Smoker    Types: Cigarettes    Last attempt to quit: 10/03/2003    Years since quitting: 14.8  . Smokeless tobacco: Never Used  . Tobacco comment: quit 10 years  Substance Use Topics  . Alcohol use: No    Alcohol/week: 0.0 standard drinks    Comment: occasional    Current Outpatient Medications:  .  albuterol (PROVENTIL HFA;VENTOLIN HFA) 108 (90 Base) MCG/ACT inhaler, Inhale 2 puffs into the lungs every 6 (six) hours as needed., Disp: 1 Inhaler, Rfl: 0 .  atorvastatin (LIPITOR) 20 MG tablet, TAKE 1 TABLET BY MOUTH EVERY DAY, Disp: 90 tablet, Rfl: 1 .  cetirizine (ZYRTEC) 10 MG tablet, Take 10 mg by mouth daily., Disp: , Rfl:  .  diltiazem (DILTIAZEM CD) 180 MG 24 hr capsule, Take 1 capsule (180 mg total) by mouth daily., Disp: 90 capsule, Rfl: 1 .  famotidine (PEPCID) 20 MG tablet, Take 1 tablet (20 mg total) by mouth 2 (two) times daily., Disp: 180 tablet, Rfl: 1 .  L-Arginine 1000 MG TABS, Take by mouth., Disp: , Rfl:  .  metoprolol succinate (TOPROL-XL) 25 MG 24 hr tablet, Take 1 tablet (25 mg total) by mouth daily., Disp: 90 tablet, Rfl: 1 .  nitroGLYCERIN (NITROSTAT) 0.3 MG SL tablet, Place 0.3 mg under the tongue every 5 (five) minutes as needed for chest pain., Disp: , Rfl:  .  aspirin 81 MG chewable tablet, Chew by mouth daily., Disp: , Rfl:  .  azithromycin (ZITHROMAX) 250 MG tablet, 2 tabs by mouth on day 1 follow  by 1 tab by mouth daily days 2- 5 (Patient not taking: Reported on 08/23/2018), Disp: 6 tablet, Rfl: 0 .  gabapentin (NEURONTIN) 400 MG capsule, Take 2 capsules po at bedtime and 1 capsule po in the AM. (Patient not taking: Reported on 08/23/2018), Disp: 270 capsule, Rfl: 3 .  predniSONE (DELTASONE) 10 MG tablet, 3 tabs by mouth x 3 days, 2 tabs by mouth x 2 days, 1 tab by mouth x 2 days and stop. (Patient not taking: Reported on 08/23/2018), Disp: 15 tablet, Rfl: 0 .  pregabalin  (LYRICA) 75 MG capsule, Take 1 capsule (75 mg total) by mouth 2 (two) times daily. (Patient not taking: Reported on 08/23/2018), Disp: 60 capsule, Rfl: 2  Allergies  Allergen Reactions  . Celebrex [Celecoxib] Swelling  . Gabapentin Other (See Comments)    Severe asthma attack  . Penicillins     Was a baby when he had reaction--unknown    Objective:  BP 134/84 Comment: pt report  Pulse 65 Comment: pt report  Temp 97.8 F (36.6 C) (Oral) Comment: pt report  Ht 5\' 10"  (1.778 m)   SpO2 95% Comment: pt report  BMI 31.94 kg/m   VITALS: Per patient if applicable, see vitals. GENERAL: Alert, appears well and in no acute distress. HEENT: Atraumatic, conjunctiva clear, no obvious abnormalities on inspection of external nose and ears. NECK: Normal movements of the head and neck. CARDIOPULMONARY: No increased WOB. Speaking in clear sentences. I:E ratio WNL.  MS: Moves all visible extremities without noticeable abnormality. PSYCH: Pleasant and cooperative, well-groomed. Speech normal rate and rhythm. Affect is appropriate. Insight and judgement are appropriate. Attention is focused, linear, and appropriate.  NEURO: CN grossly intact. Oriented as arrived to appointment on time with no prompting. Moves both UE equally.  SKIN: No obvious lesions, wounds, erythema, or cyanosis noted on face or hands.  Assessment and Plan:   There are no diagnoses linked to this encounter.  . Reviewed expectations re: course of current medical issues. . Discussed self-management of symptoms. . Outlined signs and symptoms indicating need for more acute intervention. . Patient verbalized understanding and all questions were answered. Marland Kitchen Health Maintenance issues including appropriate healthy diet, exercise, and smoking avoidance were discussed with patient. . See orders for this visit as documented in the electronic medical record.  Arnette Norris, MD 08/23/2018

## 2018-08-23 NOTE — Assessment & Plan Note (Signed)
>  25 minutes spent in face to face time with patient, >50% spent in counselling or coordination of care Given his respiratory history, will treat with course prednisone, doxycyline and albuterol as needed for wheezing. If he develops ANY worsening respiratory symptoms or fever, he is aware to go straight to ED over the weekend. The patient indicates understanding of these issues and agrees with the plan.

## 2018-09-04 ENCOUNTER — Other Ambulatory Visit (INDEPENDENT_AMBULATORY_CARE_PROVIDER_SITE_OTHER): Payer: Self-pay | Admitting: Specialist

## 2018-09-04 NOTE — Telephone Encounter (Signed)
Gabapentin refill request 

## 2018-09-18 ENCOUNTER — Other Ambulatory Visit: Payer: Self-pay

## 2018-09-18 DIAGNOSIS — I10 Essential (primary) hypertension: Secondary | ICD-10-CM

## 2018-09-26 ENCOUNTER — Other Ambulatory Visit: Payer: Self-pay

## 2018-09-26 DIAGNOSIS — I10 Essential (primary) hypertension: Secondary | ICD-10-CM

## 2018-09-26 MED ORDER — DILTIAZEM HCL ER COATED BEADS 180 MG PO CP24: 180.0000 mg | ORAL_CAPSULE | Freq: Every day | ORAL | 1 refills | Status: DC

## 2018-11-06 DIAGNOSIS — R3915 Urgency of urination: Secondary | ICD-10-CM | POA: Diagnosis not present

## 2018-11-06 DIAGNOSIS — R351 Nocturia: Secondary | ICD-10-CM | POA: Diagnosis not present

## 2019-01-10 ENCOUNTER — Other Ambulatory Visit: Payer: Self-pay

## 2019-01-10 DIAGNOSIS — E7800 Pure hypercholesterolemia, unspecified: Secondary | ICD-10-CM

## 2019-01-10 MED ORDER — ATORVASTATIN CALCIUM 20 MG PO TABS: 20.0000 mg | ORAL_TABLET | Freq: Every day | ORAL | 1 refills | Status: DC

## 2019-01-11 ENCOUNTER — Other Ambulatory Visit: Payer: Self-pay

## 2019-02-15 ENCOUNTER — Other Ambulatory Visit: Payer: Self-pay | Admitting: Family Medicine

## 2019-03-25 ENCOUNTER — Encounter: Payer: Self-pay | Admitting: Family Medicine

## 2019-03-25 ENCOUNTER — Other Ambulatory Visit: Payer: Self-pay

## 2019-03-25 ENCOUNTER — Ambulatory Visit (INDEPENDENT_AMBULATORY_CARE_PROVIDER_SITE_OTHER): Payer: BC Managed Care – PPO | Admitting: Family Medicine

## 2019-03-25 VITALS — Temp 98.2°F

## 2019-03-25 DIAGNOSIS — I201 Angina pectoris with documented spasm: Secondary | ICD-10-CM

## 2019-03-25 DIAGNOSIS — J069 Acute upper respiratory infection, unspecified: Secondary | ICD-10-CM | POA: Diagnosis not present

## 2019-03-25 MED ORDER — PREDNISONE 10 MG PO TABS: ORAL_TABLET | ORAL | 0 refills | Status: DC

## 2019-03-25 MED ORDER — DOXYCYCLINE HYCLATE 100 MG PO TABS: 100.0000 mg | ORAL_TABLET | Freq: Two times a day (BID) | ORAL | 0 refills | Status: DC

## 2019-03-25 NOTE — Assessment & Plan Note (Signed)
Given duration and progression of symptoms, will treat for bacterial sinusitis and asthma exacerbation with doxycyline and steroid burst. Call or send my chart message prn if these symptoms worsen or fail to improve as anticipated. The patient indicates understanding of these issues and agrees with the plan.

## 2019-03-25 NOTE — Progress Notes (Signed)
Virtual Visit via Video   Due to the COVID-19 pandemic, this visit was completed with telemedicine (audio/video) technology to reduce patient and provider exposure as well as to preserve personal protective equipment.   I connected with Daniel Zimmerman by a video enabled telemedicine application and verified that I am speaking with the correct person using two identifiers. Location patient: Home Location provider: [facility], Office Persons participating in the virtual visit: Aubery Lapping, MD   I discussed the limitations of evaluation and management by telemedicine and the availability of in person appointments. The patient expressed understanding and agreed to proceed.  Care Team   Patient Care Team: Lucille Passy, MD as PCP - General (Family Medicine)  Subjective:   HPI:   URI-  Feels like chest is congested. Cough has been dry.  Also having sinus issues with low-grade fever. Sx started 1.5 weeks go with sinus issues. Sinus pressure is periorbital. Nasal mucous is yellow with blood. Does have a history of asthma. He has had to use the inhaler much more frequently since Wednesday.  No myalgias, no loss of sense or smell.  He feels this is his typical "sinus infection" that goes down into his chest.  Review of Systems  Constitutional: Positive for chills and fever.  HENT: Negative.   Eyes: Negative.   Respiratory: Positive for cough. Negative for hemoptysis, sputum production, shortness of breath and wheezing.   Gastrointestinal: Negative.   Genitourinary: Negative.   Musculoskeletal: Negative for myalgias.  Skin: Negative.   All other systems reviewed and are negative.    Patient Active Problem List   Diagnosis Date Noted  . Upper respiratory infection 05/15/2018  . Fatigue 05/02/2018  . Insomnia 05/02/2018  . Radiculopathy of lumbar region 05/02/2018  . Irreducible umbilical hernia [id number]  . Lower urinary tract symptoms (LUTS) 05/02/2018  .  HLD (hyperlipidemia) 05/01/2017  . GERD (gastroesophageal reflux disease) 05/01/2017  . Prinzmetal's angina ([place]) 06/01/2015    Social History   Tobacco Use  . Smoking status: Former Smoker    Types: Cigarettes    Quit date: 10/03/2003    Years since quitting: 15.4  . Smokeless tobacco: Never Used  . Tobacco comment: quit 10 years  Substance Use Topics  . Alcohol use: No    Alcohol/week: 0.0 standard drinks    Comment: occasional    Current Outpatient Medications:  .  albuterol (PROVENTIL HFA;VENTOLIN HFA) 108 (90 Base) MCG/ACT inhaler, Inhale 2 puffs into the lungs every 6 (six) hours as needed., Disp: 1 Inhaler, Rfl: 0 .  atorvastatin (LIPITOR) 20 MG tablet, Take 1 tablet (20 mg total) by mouth daily., Disp: 90 tablet, Rfl: 1 .  cetirizine (ZYRTEC) 10 MG tablet, Take 10 mg by mouth daily., Disp: , Rfl:  .  diltiazem (DILTIAZEM CD) 180 MG 24 hr capsule, Take 1 capsule (180 mg total) by mouth daily., Disp: 90 capsule, Rfl: 1 .  famotidine (PEPCID) 20 MG tablet, TAKE 1 TABLET BY MOUTH TWICE A DAY, Disp: 180 tablet, Rfl: 1 .  finasteride (PROSCAR) 5 MG tablet, Take 5 mg by mouth daily., Disp: , Rfl:  .  L-Arginine 1000 MG TABS, Take by mouth 2 (two) times daily. , Disp: , Rfl:  .  metoprolol succinate (TOPROL-XL) 25 MG 24 hr tablet, TAKE 1 TABLET BY MOUTH EVERY DAY, Disp: 90 tablet, Rfl: 1 .  tamsulosin (FLOMAX) 0.4 MG CAPS capsule, Take 0.4 mg by mouth daily., Disp: , Rfl:  .  doxycycline (  VIBRA-TABS) 100 MG tablet, Take 1 tablet (100 mg total) by mouth 2 (two) times daily., Disp: 14 tablet, Rfl: 0 .  nitroGLYCERIN (NITROSTAT) 0.3 MG SL tablet, Place 0.3 mg under the tongue every 5 (five) minutes as needed for chest pain., Disp: , Rfl:  .  predniSONE (DELTASONE) 10 MG tablet, 3 tabs by mouth x 3 days, 2 tabs by mouth x 2 days, 1 tab by mouth x 2 days and stop., Disp: 15 tablet, Rfl: 0  Allergies  Allergen Reactions  . Celebrex [Celecoxib] Swelling  . Gabapentin Other (See Comments)     Severe asthma attack  . Penicillins     Was a baby when he had reaction--unknown    Objective:  Temp 98.2 F (36.8 C) (Oral)   VITALS: Per patient if applicable, see vitals. GENERAL: Alert, appears well and in no acute distress. HEENT: Atraumatic, conjunctiva clear, no obvious abnormalities on inspection of external nose and ears. TTP when pt palpations over frontal and maxillary sinuses bilaterally NECK: Normal movements of the head and neck. CARDIOPULMONARY: No increased WOB. Speaking in clear sentences. I:E ratio WNL.  MS: Moves all visible extremities without noticeable abnormality. PSYCH: Pleasant and cooperative, well-groomed. Speech normal rate and rhythm. Affect is appropriate. Insight and judgement are appropriate. Attention is focused, linear, and appropriate.  NEURO: CN grossly intact. Oriented as arrived to appointment on time with no prompting. Moves both UE equally.  SKIN: No obvious lesions, wounds, erythema, or cyanosis noted on face or hands.  Depression screen [facility] 2/9 05/02/2018 05/01/2017 02/01/2017  Decreased Interest 0 0 0  Down, Depressed, Hopeless 0 0 0  PHQ - 2 Score 0 0 0    Assessment and Plan:   Daniel Zimmerman was seen today for cough.  Diagnoses and all orders for this visit:  Upper respiratory tract infection, unspecified type  Prinzmetal's angina ([place])  Other orders -     doxycycline (VIBRA-TABS) 100 MG tablet; Take 1 tablet (100 mg total) by mouth 2 (two) times daily. -     predniSONE (DELTASONE) 10 MG tablet; 3 tabs by mouth x 3 days, 2 tabs by mouth x 2 days, 1 tab by mouth x 2 days and stop.    Marland Kitchen COVID-19 Education: The signs and symptoms of COVID-19 were discussed with the patient and how to seek care for testing if needed. The importance of social distancing was discussed today. . Reviewed expectations re: course of current medical issues. . Discussed self-management of symptoms. . Outlined signs and symptoms indicating need for more acute  intervention. . Patient verbalized understanding and all questions were answered. Marland Kitchen Health Maintenance issues including appropriate healthy diet, exercise, and smoking avoidance were discussed with patient. . See orders for this visit as documented in the electronic medical record.  Arnette Norris, MD  Records requested if needed. Time spent: 15 minutes, of which >50% was spent in obtaining information about his symptoms, reviewing his previous labs, evaluations, and treatments, counseling him about his condition (please see the discussed topics above), and developing a plan to further investigate it; he had a number of questions which I addressed.

## 2019-05-23 ENCOUNTER — Encounter: Payer: Self-pay | Admitting: Nurse Practitioner

## 2019-05-23 ENCOUNTER — Other Ambulatory Visit: Payer: Self-pay

## 2019-05-23 ENCOUNTER — Telehealth (INDEPENDENT_AMBULATORY_CARE_PROVIDER_SITE_OTHER): Payer: BC Managed Care – PPO | Admitting: Nurse Practitioner

## 2019-05-23 VITALS — BP 144/90 | HR 66 | Temp 96.1°F | Ht 70.0 in | Wt 215.0 lb

## 2019-05-23 DIAGNOSIS — J069 Acute upper respiratory infection, unspecified: Secondary | ICD-10-CM

## 2019-05-23 MED ORDER — ACETAMINOPHEN 500 MG PO TABS: 500.0000 mg | ORAL_TABLET | Freq: Four times a day (QID) | ORAL | 0 refills | Status: DC | PRN

## 2019-05-23 MED ORDER — DM-GUAIFENESIN ER 30-600 MG PO TB12: 1.0000 | ORAL_TABLET | Freq: Two times a day (BID) | ORAL | 0 refills | Status: DC | PRN

## 2019-05-23 MED ORDER — OSELTAMIVIR PHOSPHATE 75 MG PO CAPS: 75.0000 mg | ORAL_CAPSULE | Freq: Two times a day (BID) | ORAL | 0 refills | Status: DC

## 2019-05-23 MED ORDER — HYDROCODONE-HOMATROPINE 5-1.5 MG/5ML PO SYRP: 5.0000 mL | ORAL_SOLUTION | Freq: Four times a day (QID) | ORAL | 0 refills | Status: DC | PRN

## 2019-05-23 NOTE — Progress Notes (Addendum)
Virtual Visit via Video Note  I connected with Daniel Zimmerman on 05/23/19 at 10:45 AM EST by a video enabled telemedicine application and verified that I am speaking with the correct person using two identifiers.  Location: Patient:home Provider:office Participants: patient and provider I discussed the limitations of evaluation and management by telemedicine and the availability of in person appointments. The patient expressed understanding and agreed to proceed.  CC:pt c/ o dry cough,runny, sore throat,chills,bodyache and eyes burns/2 days/using inhaler more often.   History of Present Illness: URI  This is a new problem. The current episode started yesterday. The problem has been unchanged. Associated symptoms include congestion, coughing, headaches, joint pain, rhinorrhea, sinus pain, a sore throat and wheezing. Pertinent negatives include no abdominal pain, chest pain, diarrhea, dysuria, ear pain, joint swelling, nausea, neck pain, plugged ear sensation, rash, sneezing, swollen glands or vomiting. He has tried inhaler use for the symptoms. The treatment provided mild relief.   reports hx of Asthma, use of albuterol 2-3puffs 2-3x/day.  Observations/Objective: Physical Exam  Constitutional: He is oriented to person, place, and time. No distress.  Cardiovascular: Normal rate.  Pulmonary/Chest: Effort normal.  Neurological: He is alert and oriented to person, place, and time.  Skin: He is not diaphoretic.  Vitals reviewed.  Assessment and Plan: Daniel Zimmerman was seen today for cough.  Diagnoses and all orders for this visit:  Viral upper respiratory tract infection -     HYDROcodone-homatropine (HYCODAN) 5-1.5 MG/5ML syrup; Take 5 mLs by mouth every 6 (six) hours as needed. -     dextromethorphan-guaiFENesin (MUCINEX DM) 30-600 MG 12hr tablet; Take 1 tablet by mouth 2 (two) times daily as needed for cough. -     acetaminophen (TYLENOL) 500 MG tablet; Take 1 tablet (500 mg total) by mouth  every 6 (six) hours as needed. -     oseltamivir (TAMIFLU) 75 MG capsule; Take 1 capsule (75 mg total) by mouth 2 (two) times daily.   Follow Up Instructions: See avs   I discussed the assessment and treatment plan with the patient. The patient was provided an opportunity to ask questions and all were answered. The patient agreed with the plan and demonstrated an understanding of the instructions.   The patient was advised to call back or seek an in-person evaluation if the symptoms worsen or if the condition fails to improve as anticipated.   Wilfred Lacy, NP

## 2019-05-23 NOTE — Addendum Note (Signed)
Addended by: Wilfred Lacy L on: 05/23/2019 02:15 PM   Modules accepted: Orders, Level of Service

## 2019-05-23 NOTE — Patient Instructions (Addendum)
Due to possible inclement weather, respiratory clinic may be canceled tomorrow. So you are schedule for COVID test at [facility] tomorrow at [place]. Please do not hesitant to go to urgent care of hospital if your symptoms worsen. Ok to use robitussim or delsym for cough. Can alternate with hycodan every 4-6hrs. Continue albuterol as prescribed. Use tylenol 500mg  every 6hrs as needed for pain or fever>100.  Encourage adequate oral hydration.

## 2019-05-24 ENCOUNTER — Encounter: Payer: Self-pay | Admitting: Family Medicine

## 2019-05-24 ENCOUNTER — Ambulatory Visit: Payer: BC Managed Care – PPO

## 2019-05-24 ENCOUNTER — Ambulatory Visit: Payer: BC Managed Care – PPO | Attending: Internal Medicine

## 2019-05-24 DIAGNOSIS — Z20822 Contact with and (suspected) exposure to covid-19: Secondary | ICD-10-CM

## 2019-05-26 LAB — NOVEL CORONAVIRUS, NAA: SARS-CoV-2, NAA: NOT DETECTED

## 2019-06-01 ENCOUNTER — Other Ambulatory Visit: Payer: Self-pay | Admitting: Cardiology

## 2019-06-01 DIAGNOSIS — I10 Essential (primary) hypertension: Secondary | ICD-10-CM

## 2019-06-26 ENCOUNTER — Other Ambulatory Visit: Payer: Self-pay | Admitting: Cardiology

## 2019-06-26 DIAGNOSIS — E7800 Pure hypercholesterolemia, unspecified: Secondary | ICD-10-CM

## 2019-06-26 NOTE — Telephone Encounter (Signed)
refill 

## 2019-07-18 ENCOUNTER — Other Ambulatory Visit: Payer: Self-pay | Admitting: Cardiology

## 2019-07-18 DIAGNOSIS — E7800 Pure hypercholesterolemia, unspecified: Secondary | ICD-10-CM

## 2019-08-05 ENCOUNTER — Other Ambulatory Visit: Payer: Self-pay

## 2019-08-05 MED ORDER — METOPROLOL SUCCINATE ER 25 MG PO TB24: 25.0000 mg | ORAL_TABLET | Freq: Every day | ORAL | 0 refills | Status: DC

## 2019-08-22 ENCOUNTER — Encounter: Payer: Self-pay | Admitting: Family Medicine

## 2019-08-22 ENCOUNTER — Ambulatory Visit: Payer: BC Managed Care – PPO | Admitting: Family Medicine

## 2019-08-22 ENCOUNTER — Other Ambulatory Visit: Payer: Self-pay

## 2019-08-22 VITALS — BP 114/72 | HR 66 | Temp 98.2°F | Resp 18 | Ht 70.0 in | Wt 216.0 lb

## 2019-08-22 DIAGNOSIS — R399 Unspecified symptoms and signs involving the genitourinary system: Secondary | ICD-10-CM

## 2019-08-22 DIAGNOSIS — L570 Actinic keratosis: Secondary | ICD-10-CM | POA: Diagnosis not present

## 2019-08-22 DIAGNOSIS — E7800 Pure hypercholesterolemia, unspecified: Secondary | ICD-10-CM

## 2019-08-22 DIAGNOSIS — I10 Essential (primary) hypertension: Secondary | ICD-10-CM

## 2019-08-22 LAB — COMPREHENSIVE METABOLIC PANEL
ALT: 59 U/L — ABNORMAL HIGH (ref 0–53)
AST: 29 U/L (ref 0–37)
Albumin: 4.1 g/dL (ref 3.5–5.2)
Alkaline Phosphatase: 77 U/L (ref 39–117)
BUN: 10 mg/dL (ref 6–23)
CO2: 28 mEq/L (ref 19–32)
Calcium: 8.9 mg/dL (ref 8.4–10.5)
Chloride: 104 mEq/L (ref 96–112)
Creatinine, Ser: 0.91 mg/dL (ref 0.40–1.50)
GFR: 85.69 mL/min (ref >60.00)
Glucose, Bld: 111 mg/dL — ABNORMAL HIGH (ref 70–99)
Potassium: 4.5 mEq/L (ref 3.5–5.1)
Sodium: 137 mEq/L (ref 135–145)
Total Bilirubin: 0.7 mg/dL (ref 0.2–1.2)
Total Protein: 6.6 g/dL (ref 6.0–8.3)

## 2019-08-22 LAB — LIPID PANEL
Cholesterol: 130 mg/dL (ref 0–200)
HDL: 39.6 mg/dL (ref >39.00)
LDL Cholesterol: 70 mg/dL (ref 0–99)
NonHDL: 90.23
Total CHOL/HDL Ratio: 3
Triglycerides: 100 mg/dL (ref 0.0–149.0)
VLDL: 20 mg/dL (ref 0.0–40.0)

## 2019-08-22 NOTE — Patient Instructions (Signed)
Great to meet you!  Call or MyChart if the lesion on your head returns and we will likely plan for you to see Dermatology

## 2019-08-22 NOTE — Progress Notes (Addendum)
Subjective:     Daniel Zimmerman is a 59 y.o. male presenting for Transfer of Care (from Dr Deborra Medina) and Nodule on side of the face (nodule/swelling present on the right side of the face near the temple area. has been noticable and growing in the past 6 months. referral?)     HPI   #BPH - has not been back to the urologist - hx of rectal polyps and there was a lot of pain from the prostate exam - not taking medication at all  #Nodule - on his right temple - noticed it 6 months ago and it is getting larger in size - now it feels like a large scab - getting darker in color - itchy    Review of Systems   Social History   Tobacco Use  Smoking Status Former Smoker  . Packs/day: 0.75  . Years: 20.00  . Pack years: 15.00  . Types: Cigarettes  . Quit date: 10/03/2003  . Years since quitting: 15.8  Smokeless Tobacco Never Used  Tobacco Comment   quit 10 years        Objective:    BP Readings from Last 3 Encounters:  08/22/19 114/72  05/23/19 (!) 144/90  08/23/18 134/84   Wt Readings from Last 3 Encounters:  08/22/19 216 lb (98 kg)  05/23/19 215 lb (97.5 kg)  05/15/18 222 lb 9.6 oz (101 kg)    BP 114/72   Pulse 66   Temp 98.2 F (36.8 C)   Resp 18   Ht 5\' 10"  (1.778 m)   Wt 216 lb (98 kg)   SpO2 99%   BMI 30.99 kg/m    Physical Exam Constitutional:      Appearance: Normal appearance. He is not ill-appearing or diaphoretic.  HENT:     Right Ear: External ear normal.     Left Ear: External ear normal.     Nose: Nose normal.  Eyes:     General: No scleral icterus.    Extraocular Movements: Extraocular movements intact.     Conjunctiva/sclera: Conjunctivae normal.  Cardiovascular:     Rate and Rhythm: Normal rate and regular rhythm.     Heart sounds: No murmur.  Pulmonary:     Effort: Pulmonary effort is normal. No respiratory distress.     Breath sounds: Normal breath sounds. No wheezing.  Musculoskeletal:     Cervical back: Neck supple.   Skin:    General: Skin is warm and dry.     Comments: Right temple area: flesh colored raised macular-papular lesion which has irregular borders and is partially peeling away from the scalp.   Neurological:     Mental Status: He is alert. Mental status is at baseline.  Psychiatric:        Mood and Affect: Mood normal.           Assessment & Plan:   Problem List Items Addressed This Visit      Cardiovascular and Mediastinum   Hypertension    BP at goal. Continue diltiazem and metoprolol      Relevant Orders   Comprehensive metabolic panel     Other   HLD (hyperlipidemia) - Primary    Last labs with good control. Will repeat. Continue atorvastatin      Relevant Orders   Lipid panel   Lower urinary tract symptoms (LUTS)    Likely BPH. Continue flomax and finasteride. Endorses some nocturia but overall notes improvement       Other Visit  Diagnoses    Actinic keratosis of right temple         Procedure: Cryotherapy Indication: Skin lesion concerning for precancerous   PARQ regarding risk of blistering and that the lesion could return. Patient provided verbal consent for the procedure  Liquid nitrogen was applied for 5-7 seconds to the skin lesion x 3 and the expected blistering or scabbing reaction explained. Do not pick at the area. Patient reminded to expect hypopigmented scars from the procedure. Return if lesion fails to fully resolve.    Return in about 1 year (around 08/21/2020).  Lesleigh Noe, MD

## 2019-08-22 NOTE — Assessment & Plan Note (Signed)
Last labs with good control. Will repeat. Continue atorvastatin

## 2019-08-22 NOTE — Assessment & Plan Note (Signed)
Likely BPH. Continue flomax and finasteride. Endorses some nocturia but overall notes improvement

## 2019-08-22 NOTE — Assessment & Plan Note (Signed)
BP at goal. Continue diltiazem and metoprolol

## 2019-10-08 ENCOUNTER — Other Ambulatory Visit: Payer: Self-pay | Admitting: Cardiology

## 2019-10-08 DIAGNOSIS — E7800 Pure hypercholesterolemia, unspecified: Secondary | ICD-10-CM

## 2019-11-13 ENCOUNTER — Other Ambulatory Visit: Payer: Self-pay | Admitting: Cardiology

## 2019-11-13 DIAGNOSIS — I10 Essential (primary) hypertension: Secondary | ICD-10-CM

## 2019-11-14 NOTE — Telephone Encounter (Signed)
Patient needs to schedule an appt.

## 2019-12-16 ENCOUNTER — Other Ambulatory Visit: Payer: Self-pay | Admitting: Cardiology

## 2019-12-16 DIAGNOSIS — I10 Essential (primary) hypertension: Secondary | ICD-10-CM

## 2020-01-03 ENCOUNTER — Other Ambulatory Visit: Payer: Self-pay | Admitting: Cardiology

## 2020-01-03 DIAGNOSIS — E7800 Pure hypercholesterolemia, unspecified: Secondary | ICD-10-CM

## 2020-01-10 ENCOUNTER — Other Ambulatory Visit: Payer: Self-pay

## 2020-01-10 ENCOUNTER — Encounter: Payer: Self-pay | Admitting: Cardiology

## 2020-01-10 ENCOUNTER — Ambulatory Visit: Payer: BC Managed Care – PPO | Admitting: Cardiology

## 2020-01-10 VITALS — BP 145/101 | HR 74 | Resp 17 | Ht 70.0 in | Wt 218.0 lb

## 2020-01-10 DIAGNOSIS — E7800 Pure hypercholesterolemia, unspecified: Secondary | ICD-10-CM | POA: Diagnosis not present

## 2020-01-10 DIAGNOSIS — R0602 Shortness of breath: Secondary | ICD-10-CM

## 2020-01-10 DIAGNOSIS — I10 Essential (primary) hypertension: Secondary | ICD-10-CM | POA: Diagnosis not present

## 2020-01-10 DIAGNOSIS — J45909 Unspecified asthma, uncomplicated: Secondary | ICD-10-CM | POA: Diagnosis not present

## 2020-01-10 MED ORDER — BYSTOLIC 20 MG PO TABS: 20.0000 mg | ORAL_TABLET | Freq: Every day | ORAL | 2 refills | Status: DC

## 2020-01-10 MED ORDER — HYDRALAZINE HCL 25 MG PO TABS: 25.0000 mg | ORAL_TABLET | Freq: Three times a day (TID) | ORAL | 2 refills | Status: DC | PRN

## 2020-01-10 NOTE — Progress Notes (Signed)
Primary Physician/Referring:  Lesleigh Noe, MD  Patient ID: Daniel Zimmerman, male    DOB: 29-Mar-1961, 59 y.o.   MRN: [id number]  Chief Complaint  Patient presents with  . Hypertension  . Follow-up   HPI:    Daniel Zimmerman  is a 59 y.o. Caucasian male who is extremely sensitive for hypertension, also has hyperlipidemia, last seen by me about 2 years ago, presents back to reestablish care as he is having difficulty with control of blood pressure, worsening dyspnea.  States that for the past month or so he is noticing that his blood pressure is elevated, this is associated with headaches, worsening dyspnea and not feeling well.  No PND or orthopnea.  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  He has no exertional chest pain.  But does indeed notice that lately he has been getting short of breath when he does things associated with headache and elevated blood pressure.  No dizziness or syncope.  Past Medical History:  Diagnosis Date  . Allergy   . Allergy-induced asthma    in past  . Arthritis   . Chicken pox   . Colon polyps 02/11/2014   Sessile serrated polyps x 2  . GERD (gastroesophageal reflux disease)   . Heart disease   . Hyperlipidemia   . Hypertension    Past Surgical History:  Procedure Laterality Date  . ROTATOR CUFF REPAIR Right   . SPINE SURGERY     Cervical--ruptured disc  . UMBILICAL HERNIA REPAIR  2019  . WRIST SURGERY Left    Family History  Problem Relation Age of Onset  . Arthritis Mother   . Hyperlipidemia Mother   . Hypertension Mother   . Skin cancer Mother        unknown  . Arthritis Father   . COPD Father   . Hypertension Maternal Uncle   . Arthritis Maternal Grandmother   . Hypertension Maternal Grandmother   . Heart attack Maternal Grandmother 60  . Arthritis Maternal Grandfather   . Arthritis  Paternal Grandmother   . Heart disease Paternal Grandmother   . Arthritis Paternal Grandfather   . Prostate cancer Paternal Grandfather 82    Social History   Tobacco Use  . Smoking status: Former Smoker    Packs/day: 0.75    Years: 20.00    Pack years: 15.00    Types: Cigarettes    Quit date: 10/03/2003    Years since quitting: 16.2  . Smokeless tobacco: Never Used  . Tobacco comment: quit 10 years  Substance Use Topics  . Alcohol use: Yes    Alcohol/week: 0.0 standard drinks    Comment: once a month   Marital Status: Married  ROS  Review of Systems  Cardiovascular: Positive for dyspnea on exertion. Negative for chest pain and leg swelling.  Gastrointestinal: Negative for melena.   Objective  Blood pressure (!) 145/101, pulse 74, resp. rate 17, height 5\' 10"  (1.778 m), weight 218 lb (98.9 kg), SpO2 95 %.  Vitals with BMI 01/10/2020 01/10/2020 08/22/2019  Height - 5\' 10"  5\' 10"   Weight - 218 lbs 216 lbs  BMI - 63.87 56.43  Systolic 329 518 841  Diastolic 660 95 72  Pulse 74 70 66     Physical Exam Cardiovascular:     Rate and Rhythm: Normal rate  and regular rhythm.     Pulses: Intact distal pulses.     Heart sounds: Normal heart sounds. No murmur heard.  No gallop.      Comments: Trace leg edema, no JVD. Pulmonary:     Effort: Pulmonary effort is normal.     Breath sounds: Normal breath sounds.  Abdominal:     General: Bowel sounds are normal.     Palpations: Abdomen is soft.    Laboratory examination:   Recent Labs    08/22/19 0850  NA 137  K 4.5  CL 104  CO2 28  GLUCOSE 111*  BUN 10  CREATININE 0.91  CALCIUM 8.9   CrCl cannot be calculated (Patient's most recent lab result is older than the maximum 21 days allowed.).  CMP Latest Ref Rng & Units 08/22/2019 05/02/2018 05/01/2017  Glucose 70 - 99 mg/dL 111(H) 105(H) 86  BUN 6 - 23 mg/dL 10 10 11   Creatinine 0.40 - 1.50 mg/dL 0.91 0.89 0.93  Sodium 135 - 145 mEq/L 137 141 140  Potassium 3.5 - 5.1 mEq/L  4.5 4.6 4.4  Chloride 96 - 112 mEq/L 104 105 105  CO2 19 - 32 mEq/L 28 30 30   Calcium 8.4 - 10.5 mg/dL 8.9 9.1 8.8  Total Protein 6.0 - 8.3 g/dL 6.6 6.7 6.6  Total Bilirubin 0.2 - 1.2 mg/dL 0.7 0.6 0.6  Alkaline Phos 39 - 117 U/L 77 82 75  AST 0 - 37 U/L 29 29 23   ALT 0 - 53 U/L 59(H) 53 45   CBC Latest Ref Rng & Units 05/02/2018 05/01/2017 02/01/2017  WBC 4.0 - 10.5 K/uL 6.0 6.0 6.0  Hemoglobin 13.0 - 17.0 g/dL 16.1 15.5 15.8  Hematocrit 39 - 52 % 47.5 46.4 47.4  Platelets 150 - 400 K/uL 231.0 225.0 221.0    Lipid Panel Recent Labs    08/22/19 0850  CHOL 130  TRIG 100.0  LDLCALC 70  VLDL 20.0  HDL 39.60  CHOLHDL 3    HEMOGLOBIN A1C No results found for: HGBA1C, MPG TSH No results for input(s): TSH in the last 8760 hours.  External labs:     Medications and allergies   Allergies  Allergen Reactions  . Gabapentin Other (See Comments)    Severe asthma attack  . Diltiazem Other (See Comments)    Edema  . Celebrex [Celecoxib] Swelling  . Penicillins     Was a baby when he had reaction--unknown     Outpatient Medications Prior to Visit  Medication Sig Dispense Refill  . albuterol (PROVENTIL HFA;VENTOLIN HFA) 108 (90 Base) MCG/ACT inhaler Inhale 2 puffs into the lungs every 6 (six) hours as needed. 1 Inhaler 0  . atorvastatin (LIPITOR) 20 MG tablet TAKE 1 TABLET BY MOUTH EVERY DAY 90 tablet 0  . cetirizine (ZYRTEC) 10 MG tablet Take 10 mg by mouth daily.    . famotidine (PEPCID) 20 MG tablet TAKE 1 TABLET BY MOUTH TWICE A DAY 180 tablet 1  . finasteride (PROSCAR) 5 MG tablet Take 5 mg by mouth daily.    Marland Kitchen L-Arginine 1000 MG TABS Take by mouth 2 (two) times daily.     Marland Kitchen diltiazem (CARDIZEM CD) 180 MG 24 hr capsule TAKE 1 CAPSULE BY MOUTH EVERY DAY 30 capsule 0  . tamsulosin (FLOMAX) 0.4 MG CAPS capsule Take 0.4 mg by mouth daily.    . metoprolol succinate (TOPROL-XL) 25 MG 24 hr tablet Take 1 tablet (25 mg total) by mouth daily. 30 tablet 0  No  facility-administered medications prior to visit.     Radiology:   CT Scan of Abdomen  [07/2016]: Diverticulitis, Liver Cyst, Pancrease Cyst, Kidney Cyst  Cardiac Studies:   Lexiscan sestamibi stress test 09/11/2015: 1. The resting electrocardiogram demonstrated normal sinus rhythm, incomplete RBBB and no resting arrhythmias. Stress EKG is non-diagnostic for ischemia as it a pharmacologic stress using Lexiscan. Stress symptoms included dyspnea. 2. Myocardial perfusion imaging is normal. Overall left ventricular systolic function was normal without regional wall motion abnormalities. The left ventricular ejection fraction was 56%  EKG  EKG 01/10/2020: Normal sinus rhythm with rate of 72 bpm, left atrial enlargement, left axis deviation.  Incomplete right bundle branch block.  Poor R wave progression, cannot exclude anteroseptal infarct old.  Low-voltage complexes.  No evidence of ischemia.    Assessment     ICD-10-CM   1. Essential hypertension  I10 EKG 12-Lead    Nebivolol HCl (BYSTOLIC) 20 MG TABS    PCV ECHOCARDIOGRAM COMPLETE  2. Shortness of breath  R06.02 PCV ECHOCARDIOGRAM COMPLETE  3. Hypercholesteremia  E78.00      Medications Discontinued During This Encounter  Medication Reason  . diltiazem (CARDIZEM CD) 180 MG 24 hr capsule Side effect (s)  . metoprolol succinate (TOPROL-XL) 25 MG 24 hr tablet Change in therapy  . diltiazem (CARDIZEM CD) 180 MG 24 hr capsule Side effect (s)    Meds ordered this encounter  Medications  . hydrALAZINE (APRESOLINE) 25 MG tablet    Sig: Take 1 tablet (25 mg total) by mouth 3 (three) times daily as needed (BP >140/80).    Dispense:  90 tablet    Refill:  2  . Nebivolol HCl (BYSTOLIC) 20 MG TABS    Sig: Take 1 tablet (20 mg total) by mouth daily.    Dispense:  30 tablet    Refill:  2    Recommendations:   Daniel Zimmerman is a 59 y.o. Caucasian male who is extremely sensitive for hypertension, also has hyperlipidemia, last seen by  me about 2 years ago, presents back to reestablish care as he is having difficulty with control of blood pressure, worsening dyspnea.  Previously had not tolerated diltiazem due to leg edema.  I have discontinued this.  He has been having more frequent episodes of asthma and presently on low-dose beta-blocker therapy, will discontinue this and I will try to use Bystolic 20 mg daily.  His dyspnea may be related to uncontrolled hypertension.  I will obtain an echocardiogram to evaluate.  I have also given him a prescription for hydralazine 25 mg p.o. 3 times daily as needed.  Suspect he probably will need a second agent for his hypertension, however he also reacts fairly aggressively with medications.  Without beta-blocker therapy, previously he has had palpitations and blood pressure control was difficult.  Hence we will continue beta-blocker therapy for now.  With regard to hyperlipidemia, lipids are well controlled.  I reviewed his external labs.  I would like to see him back in 4 weeks for follow-up.    Adrian Prows, MD, [facility] 01/10/2020, 2:23 PM Office: [phone]

## 2020-01-13 ENCOUNTER — Ambulatory Visit: Payer: BC Managed Care – PPO

## 2020-01-13 ENCOUNTER — Other Ambulatory Visit: Payer: Self-pay

## 2020-01-13 DIAGNOSIS — R0602 Shortness of breath: Secondary | ICD-10-CM

## 2020-01-13 DIAGNOSIS — I10 Essential (primary) hypertension: Secondary | ICD-10-CM

## 2020-01-18 ENCOUNTER — Other Ambulatory Visit: Payer: Self-pay | Admitting: Cardiology

## 2020-01-18 DIAGNOSIS — I10 Essential (primary) hypertension: Secondary | ICD-10-CM

## 2020-02-12 ENCOUNTER — Encounter: Payer: Self-pay | Admitting: Cardiology

## 2020-02-12 ENCOUNTER — Ambulatory Visit: Payer: BC Managed Care – PPO | Admitting: Cardiology

## 2020-02-12 ENCOUNTER — Other Ambulatory Visit: Payer: Self-pay

## 2020-02-12 VITALS — BP 115/74 | HR 69 | Resp 16 | Ht 70.0 in | Wt 214.0 lb

## 2020-02-12 DIAGNOSIS — I10 Essential (primary) hypertension: Secondary | ICD-10-CM

## 2020-02-12 MED ORDER — BYSTOLIC 20 MG PO TABS: 20.0000 mg | ORAL_TABLET | Freq: Every day | ORAL | 3 refills | Status: DC

## 2020-02-12 NOTE — Progress Notes (Signed)
Primary Physician/Referring:  Lesleigh Noe, MD  Patient ID: Daniel Zimmerman, male    DOB: [birth year], 59 y.o.   MRN: [id number]  Chief Complaint  Patient presents with   Essential hypertension   Follow-up    4 weeks    HPI:    ESTUS KRAKOWSKI  is a 59 y.o. Caucasian male who is extremely sensitive for hypertension, also has hyperlipidemia, last seen by me about 2 years ago, presented back one month ago to reestablish care as he was having difficulty with control of blood pressure and experiencing worsening dyspnea.  The patient presents today for follow up of hypertension. At last visit diltiazem was discontinued and he was started on Bystolic as well as PRN hydralazine. He is tolerating this without side effects. He monitors his blood pressure at home and reports average values 120/70s. He states his shortness of breath has improved and he is overall feeling much better. Denies leg swelling, palpitations, chest pain.  Past Medical History:  Diagnosis Date   Allergy    Allergy-induced asthma    in past   Arthritis    Chicken pox    Colon polyps 02/11/2014   Sessile serrated polyps x 2   GERD (gastroesophageal reflux disease)    Heart disease    Hyperlipidemia    Hypertension    Past Surgical History:  Procedure Laterality Date   ROTATOR CUFF REPAIR Right    SPINE SURGERY     Cervical--ruptured disc   UMBILICAL HERNIA REPAIR  2019   WRIST SURGERY Left    Family History  Problem Relation Age of Onset   Arthritis Mother    Hyperlipidemia Mother    Hypertension Mother    Skin cancer Mother        unknown   Arthritis Father    COPD Father    Hypertension Maternal Uncle    Arthritis Maternal Grandmother    Hypertension Maternal Grandmother    Heart attack Maternal Grandmother 98   Arthritis Maternal Grandfather    Arthritis Paternal Grandmother    Heart disease Paternal Grandmother    Arthritis Paternal Grandfather    Prostate cancer  Paternal Grandfather 68    Social History   Tobacco Use   Smoking status: Former Smoker    Packs/day: 0.75    Years: 20.00    Pack years: 15.00    Types: Cigarettes    Quit date: 10/03/2003    Years since quitting: 16.3   Smokeless tobacco: Never Used   Tobacco comment: quit 10 years  Substance Use Topics   Alcohol use: Yes    Alcohol/week: 0.0 standard drinks    Comment: once a month   Marital Status: Married  ROS  Review of Systems  Cardiovascular: Negative for chest pain, dyspnea on exertion, leg swelling and palpitations.   Objective  Blood pressure 115/74, pulse 69, resp. rate 16, height 5\' 10"  (1.778 m), weight 97.1 kg, SpO2 95 %.  Vitals with BMI 02/12/2020 01/10/2020 01/10/2020  Height 5\' 10"  - 5\' 10"   Weight 214 lbs - 218 lbs  BMI 35.32 - 99.24  Systolic 268 341 962  Diastolic 74 229 95  Pulse 69 74 70     Physical Exam Cardiovascular:     Rate and Rhythm: Normal rate and regular rhythm.     Pulses: Intact distal pulses.     Heart sounds: Normal heart sounds. No murmur heard.  No gallop.      Comments: No leg edema, no JVD.  Pulmonary:     Effort: Pulmonary effort is normal.     Breath sounds: Normal breath sounds.  Abdominal:     General: Bowel sounds are normal.     Palpations: Abdomen is soft.    Laboratory examination:   Recent Labs    08/22/19 0850  NA 137  K 4.5  CL 104  CO2 28  GLUCOSE 111*  BUN 10  CREATININE 0.91  CALCIUM 8.9   CrCl cannot be calculated (Patient's most recent lab result is older than the maximum 21 days allowed.).  CMP Latest Ref Rng & Units 08/22/2019 05/02/2018 05/01/2017  Glucose 70 - 99 mg/dL 111(H) 105(H) 86  BUN 6 - 23 mg/dL 10 10 11   Creatinine 0.40 - 1.50 mg/dL 0.91 0.89 0.93  Sodium 135 - 145 mEq/L 137 141 140  Potassium 3.5 - 5.1 mEq/L 4.5 4.6 4.4  Chloride 96 - 112 mEq/L 104 105 105  CO2 19 - 32 mEq/L 28 30 30   Calcium 8.4 - 10.5 mg/dL 8.9 9.1 8.8  Total Protein 6.0 - 8.3 g/dL 6.6 6.7 6.6  Total  Bilirubin 0.2 - 1.2 mg/dL 0.7 0.6 0.6  Alkaline Phos 39 - 117 U/L 77 82 75  AST 0 - 37 U/L 29 29 23   ALT 0 - 53 U/L 59(H) 53 45   CBC Latest Ref Rng & Units 05/02/2018 05/01/2017 02/01/2017  WBC 4.0 - 10.5 K/uL 6.0 6.0 6.0  Hemoglobin 13.0 - 17.0 g/dL 16.1 15.5 15.8  Hematocrit 39 - 52 % 47.5 46.4 47.4  Platelets 150 - 400 K/uL 231.0 225.0 221.0    Lipid Panel Recent Labs    08/22/19 0850  CHOL 130  TRIG 100.0  LDLCALC 70  VLDL 20.0  HDL 39.60  CHOLHDL 3    HEMOGLOBIN A1C No results found for: HGBA1C, MPG TSH No results for input(s): TSH in the last 8760 hours.  External labs:   Medications and allergies   Allergies  Allergen Reactions   Gabapentin Other (See Comments)    Severe asthma attack   Diltiazem Other (See Comments)    Edema   Celebrex [Celecoxib] Swelling   Penicillins     Was a baby when he had reaction--unknown     Outpatient Medications Prior to Visit  Medication Sig Dispense Refill   albuterol (PROVENTIL HFA;VENTOLIN HFA) 108 (90 Base) MCG/ACT inhaler Inhale 2 puffs into the lungs every 6 (six) hours as needed. 1 Inhaler 0   atorvastatin (LIPITOR) 20 MG tablet TAKE 1 TABLET BY MOUTH EVERY DAY 90 tablet 0   cetirizine (ZYRTEC) 10 MG tablet Take 10 mg by mouth daily.     famotidine (PEPCID) 20 MG tablet TAKE 1 TABLET BY MOUTH TWICE A DAY 180 tablet 1   hydrALAZINE (APRESOLINE) 25 MG tablet Take 1 tablet (25 mg total) by mouth 3 (three) times daily as needed (BP >140/80). 90 tablet 2   L-Arginine 1000 MG TABS Take by mouth 2 (two) times daily.      Nebivolol HCl (BYSTOLIC) 20 MG TABS Take 1 tablet (20 mg total) by mouth daily. 30 tablet 2   finasteride (PROSCAR) 5 MG tablet Take 5 mg by mouth daily. (Patient not taking: Reported on 02/12/2020)     tamsulosin (FLOMAX) 0.4 MG CAPS capsule Take 0.4 mg by mouth daily. (Patient not taking: Reported on 02/12/2020)     No facility-administered medications prior to visit.   Radiology:   CT Scan  of Abdomen  [07/2016]: Diverticulitis, Liver Cyst, Pancrease Cyst,  Kidney Cyst  Cardiac Studies:   Lexiscan sestamibi stress test 09/11/2015: 1. The resting electrocardiogram demonstrated normal sinus rhythm, incomplete RBBB and no resting arrhythmias. Stress EKG is non-diagnostic for ischemia as it a pharmacologic stress using Lexiscan. Stress symptoms included dyspnea. 2. Myocardial perfusion imaging is normal. Overall left ventricular systolic function was normal without regional wall motion abnormalities. The left ventricular ejection fraction was 56%  Echocardiogram 01/13/2020: 1. Left ventricle cavity is normal in size and wall thickness. Normal global wall motion. Normal LV systolic function with visual EF 50-55%. Normal diastolic filling pattern. 2. No significant valvular abnormalities. 3. IVC not visualized.  EKG EKG 01/10/2020: Normal sinus rhythm with rate of 72 bpm, left atrial enlargement, left axis deviation.  Incomplete right bundle branch block.  Poor R wave progression, cannot exclude anteroseptal infarct old.  Low-voltage complexes.  No evidence of ischemia.    Assessment     ICD-10-CM   1. Essential hypertension  I10 Nebivolol HCl (BYSTOLIC) 20 MG TABS     Medications Discontinued During This Encounter  Medication Reason   finasteride (PROSCAR) 5 MG tablet Error   tamsulosin (FLOMAX) 0.4 MG CAPS capsule Error   Nebivolol HCl (BYSTOLIC) 20 MG TABS Reorder    Meds ordered this encounter  Medications   Nebivolol HCl (BYSTOLIC) 20 MG TABS    Sig: Take 1 tablet (20 mg total) by mouth daily.    Dispense:  90 tablet    Refill:  3   Recommendations:   BRAYEN BUNN is a 59 y.o. Caucasian male who is extremely sensitive for hypertension, also has hyperlipidemia, last seen by me about 2 years ago, presented back to reestablish care one month ago for difficult to control hypertension and worsening dyspnea. At last visit he was started on bystolic 20 mg and PRN  hydralazine. Diltiazem was discontinued due to leg edema.   The patient presents for one month follow up for hypertension. His blood pressure is now well controlled. His dyspnea has improved and overall he is feeling much better. His leg edema has resolved since discontinuing diltiazem. His echocardiogram was reviewed and was normal. Continue Bystolic 20 mg daily and hydralazine 25 mg three times daily as needed. He should follow up in clinic in one year.  Blair Heys, PA Student 02/12/20 3:07 PM   Patient seen and examined in conjunction with Blair Heys, PA second year student at [employer].  Time spent is in direct patient face to face encounter not including the teaching and training involved.    Adrian Prows, MD, [facility] 02/12/2020, 5:11 PM Office: [phone]

## 2020-03-27 ENCOUNTER — Other Ambulatory Visit: Payer: Self-pay | Admitting: Cardiology

## 2020-03-27 DIAGNOSIS — E7800 Pure hypercholesterolemia, unspecified: Secondary | ICD-10-CM

## 2020-04-03 ENCOUNTER — Other Ambulatory Visit: Payer: Self-pay | Admitting: Cardiology

## 2020-05-11 ENCOUNTER — Other Ambulatory Visit: Payer: Self-pay

## 2020-05-11 ENCOUNTER — Encounter: Payer: Self-pay | Admitting: Family Medicine

## 2020-05-11 ENCOUNTER — Telehealth: Payer: Self-pay

## 2020-05-11 ENCOUNTER — Ambulatory Visit (INDEPENDENT_AMBULATORY_CARE_PROVIDER_SITE_OTHER): Payer: BC Managed Care – PPO | Admitting: Family Medicine

## 2020-05-11 DIAGNOSIS — I10 Essential (primary) hypertension: Secondary | ICD-10-CM

## 2020-05-11 DIAGNOSIS — R519 Headache, unspecified: Secondary | ICD-10-CM | POA: Insufficient documentation

## 2020-05-11 LAB — CBC WITH DIFFERENTIAL/PLATELET
Basophils Absolute: 0.1 10*3/uL (ref 0.0–0.1)
Basophils Relative: 1 % (ref 0.0–3.0)
Eosinophils Absolute: 0.4 10*3/uL (ref 0.0–0.7)
Eosinophils Relative: 4.9 % (ref 0.0–5.0)
HCT: 47.7 % (ref 39.0–52.0)
Hemoglobin: 16.4 g/dL (ref 13.0–17.0)
Lymphocytes Relative: 23.9 % (ref 12.0–46.0)
Lymphs Abs: 1.8 10*3/uL (ref 0.7–4.0)
MCHC: 34.3 g/dL (ref 30.0–36.0)
MCV: 88.6 fl (ref 78.0–100.0)
Monocytes Absolute: 0.6 10*3/uL (ref 0.1–1.0)
Monocytes Relative: 7.6 % (ref 3.0–12.0)
Neutro Abs: 4.8 10*3/uL (ref 1.4–7.7)
Neutrophils Relative %: 62.6 % (ref 43.0–77.0)
Platelets: 226 10*3/uL (ref 150.0–400.0)
RBC: 5.39 Mil/uL (ref 4.22–5.81)
RDW: 13.3 % (ref 11.5–15.5)
WBC: 7.6 10*3/uL (ref 4.0–10.5)

## 2020-05-11 LAB — BASIC METABOLIC PANEL
BUN: 11 mg/dL (ref 6–23)
CO2: 26 mEq/L (ref 19–32)
Calcium: 8.9 mg/dL (ref 8.4–10.5)
Chloride: 104 mEq/L (ref 96–112)
Creatinine, Ser: 1.04 mg/dL (ref 0.40–1.50)
GFR: 79.25 mL/min (ref >60.00)
Glucose, Bld: 99 mg/dL (ref 70–99)
Potassium: 5.2 mEq/L — ABNORMAL HIGH (ref 3.5–5.1)
Sodium: 136 mEq/L (ref 135–145)

## 2020-05-11 LAB — C-REACTIVE PROTEIN: CRP: <1 mg/dL (ref 0.5–20.0)

## 2020-05-11 LAB — SEDIMENTATION RATE: Sed Rate: 10 mm/hr (ref 0–20)

## 2020-05-11 MED ORDER — TRAMADOL HCL 50 MG PO TABS: 50.0000 mg | ORAL_TABLET | Freq: Three times a day (TID) | ORAL | 0 refills | Status: AC | PRN

## 2020-05-11 MED ORDER — NEBIVOLOL HCL 20 MG PO TABS: 20.0000 mg | ORAL_TABLET | Freq: Every day | ORAL | 3 refills | Status: DC

## 2020-05-11 NOTE — Assessment & Plan Note (Signed)
New R sided facial pain with tenderness of temple  No rash  No vision change No congestion or hearing problems  No h/o TMJ , no grinding of jaw Remote hx of CS disease  Differential includes sinus inflammation/inf, dental cause, temporal arteritis, zoster before rash  Labs ordered  Lab Orders     CBC with Differential/Platelet     Basic metabolic panel     Sedimentation Rate     C-reactive protein   Unable to take gabapentin  Would consider prednisone if labs are ok  Will watch for rash or congestion or other symptoms Enc to touch base with dentist  Short term tramadol px for pain prn (warned of sedation and habit and constipation)   Consider imaging if symptoms persist/labs unrevealing

## 2020-05-11 NOTE — Progress Notes (Signed)
Subjective:    Patient ID: Daniel Zimmerman, male    DOB: [birth year], 59 y.o.   MRN: [id number]  This visit occurred during the SARS-CoV-2 public health emergency.  Safety protocols were in place, including screening questions prior to the visit, additional usage of staff PPE, and extensive cleaning of exam room while observing appropriate contact time as indicated for disinfecting solutions.    HPI 59 yo pt of Dr Selena Batten presents with facial pain  H/o cervical spine dz as well as prinzmetal's angina   Wt Readings from Last 3 Encounters:  05/11/20 212 lb (96.2 kg)  02/12/20 214 lb (97.1 kg)  01/10/20 218 lb (98.9 kg)   30.42 kg/m  Symptoms started "like a toothache"  Tuesday -started Now the whole side of his face (right side) hurts Ear and eye hurt  Has not noticed a rash  No change in vision and eye does not water  Tender in the painful areas-including ear and temple   No ear fullness or change in hearing   Thinks sinuses feel a little swollen  Not congested  occ a little yellow mucous -not bad (h/o sinus problems)  BP Readings from Last 3 Encounters:  05/11/20 134/80  02/12/20 115/74  01/10/20 (!) 145/101   Pulse Readings from Last 3 Encounters:  05/11/20 (!) 50  02/12/20 69  01/10/20 74    Pain comes in waves  Sharp/like a nerve pain  Has not slept in 3 nights  Has tried excedrin/ migraine Ibuprofen   No ST No fever Not dizzy   Has a h/o nerve pain in the past  Allergic to gabapentin -severely  Has had surgery on neck (plate and screws)   Neuro surgeon- Dr Bertram Millard   No new dental issues  This does not feel dental  No new tooth pain or sensitivity   Patient Active Problem List   Diagnosis Date Noted  . Facial pain 05/11/2020  . Fatigue 05/02/2018  . Insomnia 05/02/2018  . Radiculopathy of lumbar region 05/02/2018  . Irreducible umbilical hernia 05/02/2018  . Lower urinary tract symptoms (LUTS) 05/02/2018  . HLD (hyperlipidemia) 05/01/2017  .  GERD (gastroesophageal reflux disease) 05/01/2017  . Hypertension 06/01/2015  . Prinzmetal's angina (HCC) 06/01/2015   Past Medical History:  Diagnosis Date  . Allergy   . Allergy-induced asthma    in past  . Arthritis   . Chicken pox   . Colon polyps 02/11/2014   Sessile serrated polyps x 2  . GERD (gastroesophageal reflux disease)   . Heart disease   . Hyperlipidemia   . Hypertension    Past Surgical History:  Procedure Laterality Date  . ROTATOR CUFF REPAIR Right   . SPINE SURGERY     Cervical--ruptured disc  . UMBILICAL HERNIA REPAIR  2019  . WRIST SURGERY Left    Social History   Tobacco Use  . Smoking status: Former Smoker    Packs/day: 0.75    Years: 20.00    Pack years: 15.00    Types: Cigarettes    Quit date: 10/03/2003    Years since quitting: 16.6  . Smokeless tobacco: Never Used  . Tobacco comment: quit 10 years  Vaping Use  . Vaping Use: Never used  Substance Use Topics  . Alcohol use: Yes    Alcohol/week: 0.0 standard drinks    Comment: once a month  . Drug use: No   Family History  Problem Relation Age of Onset  . Arthritis Mother   .  Hyperlipidemia Mother   . Hypertension Mother   . Skin cancer Mother        unknown  . Arthritis Father   . COPD Father   . Hypertension Maternal Uncle   . Arthritis Maternal Grandmother   . Hypertension Maternal Grandmother   . Heart attack Maternal Grandmother 60  . Arthritis Maternal Grandfather   . Arthritis Paternal Grandmother   . Heart disease Paternal Grandmother   . Arthritis Paternal Grandfather   . Prostate cancer Paternal Grandfather 20   Allergies  Allergen Reactions  . Gabapentin Other (See Comments)    Severe asthma attack  . Diltiazem Other (See Comments)    Edema  . Celebrex [Celecoxib] Swelling  . Penicillins     Was a baby when he had reaction--unknown   Current Outpatient Medications on File Prior to Visit  Medication Sig Dispense Refill  . albuterol (PROVENTIL HFA;VENTOLIN  HFA) 108 (90 Base) MCG/ACT inhaler Inhale 2 puffs into the lungs every 6 (six) hours as needed. 1 Inhaler 0  . atorvastatin (LIPITOR) 20 MG tablet TAKE 1 TABLET BY MOUTH EVERY DAY 90 tablet 0  . cetirizine (ZYRTEC) 10 MG tablet Take 10 mg by mouth daily.    . famotidine (PEPCID) 20 MG tablet TAKE 1 TABLET BY MOUTH TWICE A DAY 180 tablet 1  . hydrALAZINE (APRESOLINE) 25 MG tablet Take 1 tablet (25 mg total) by mouth 3 (three) times daily as needed (BP >140/80). 270 tablet 1  . L-Arginine 1000 MG TABS Take by mouth 2 (two) times daily.      No current facility-administered medications on file prior to visit.    Review of Systems  Constitutional: Negative for activity change, appetite change, fatigue, fever and unexpected weight change.  HENT: Positive for ear pain and mouth sores. Negative for congestion, dental problem, ear discharge, facial swelling, rhinorrhea, sinus pressure, sore throat, tinnitus, trouble swallowing and voice change.        Had some mouth ulcers early in the week  Eyes: Negative for pain, redness, itching and visual disturbance.  Respiratory: Negative for cough, chest tightness, shortness of breath and wheezing.   Cardiovascular: Negative for chest pain and palpitations.  Gastrointestinal: Negative for abdominal pain, blood in stool, constipation, diarrhea and nausea.  Endocrine: Negative for cold intolerance, heat intolerance, polydipsia and polyuria.  Genitourinary: Negative for difficulty urinating, dysuria, frequency and urgency.  Musculoskeletal: Negative for arthralgias, joint swelling and myalgias.  Skin: Negative for color change, pallor and rash.  Neurological: Negative for dizziness, tremors, facial asymmetry, weakness, light-headedness, numbness and headaches.  Hematological: Negative for adenopathy. Does not bruise/bleed easily.  Psychiatric/Behavioral: Negative for decreased concentration and dysphoric mood. The patient is not nervous/anxious.         Objective:   Physical Exam Constitutional:      Appearance: Normal appearance. He is obese. He is not ill-appearing or diaphoretic.  HENT:     Head: Normocephalic and atraumatic.     Comments: Tender/hypersensitive over R side of face  No facial droop   No TMJ clicks     Right Ear: Tympanic membrane, ear canal and external ear normal.     Left Ear: Tympanic membrane, ear canal and external ear normal.     Nose: Nose normal.     Mouth/Throat:     Mouth: Mucous membranes are moist.     Pharynx: Oropharynx is clear. No oropharyngeal exudate or posterior oropharyngeal erythema.  Eyes:     General: No scleral icterus.  Right eye: No discharge.        Left eye: No discharge.     Conjunctiva/sclera: Conjunctivae normal.     Pupils: Pupils are equal, round, and reactive to light.  Neck:     Vascular: No carotid bruit.  Cardiovascular:     Rate and Rhythm: Regular rhythm. Bradycardia present.     Pulses: Normal pulses.     Heart sounds: Normal heart sounds.  Pulmonary:     Effort: Pulmonary effort is normal. No respiratory distress.     Breath sounds: Normal breath sounds. No wheezing.  Musculoskeletal:     Cervical back: Normal range of motion and neck supple. No rigidity.  Lymphadenopathy:     Cervical: No cervical adenopathy.  Skin:    General: Skin is warm and dry.     Findings: No erythema or rash.  Neurological:     Mental Status: He is alert.     Cranial Nerves: No cranial nerve deficit.     Sensory: No sensory deficit.     Motor: No weakness.     Coordination: Coordination normal.     Deep Tendon Reflexes: Reflexes normal.  Psychiatric:        Mood and Affect: Mood normal.           Assessment & Plan:   Problem List Items Addressed This Visit      Other   Facial pain    New R sided facial pain with tenderness of temple  No rash  No vision change No congestion or hearing problems  No h/o TMJ , no grinding of jaw Remote hx of CS  disease  Differential includes sinus inflammation/inf, dental cause, temporal arteritis, zoster before rash  Labs ordered  Lab Orders     CBC with Differential/Platelet     Basic metabolic panel     Sedimentation Rate     C-reactive protein   Unable to take gabapentin  Would consider prednisone if labs are ok  Will watch for rash or congestion or other symptoms Enc to touch base with dentist  Short term tramadol px for pain prn (warned of sedation and habit and constipation)   Consider imaging if symptoms persist/labs unrevealing      Relevant Orders   CBC with Differential/Platelet   Basic metabolic panel   Sedimentation Rate   C-reactive protein

## 2020-05-11 NOTE — Telephone Encounter (Signed)
[facility] Night - Client Nonclinical Telephone Record AccessNurse Client [facility] Night - Client Client Site [facility] - Night Physician Gweneth Dimitri- MD Contact Type Call Who Is Calling Patient / Member / Family / Caregiver Caller Name Darla Lesches Phone Number [phone] Patient Name Daniel Zimmerman Patient DOB 12-20-1960 Call Type Message Only Information Provided Reason for Call Request to Schedule Office Appointment Initial Comment Caller states that her husband has really bad jaw and ear pain. Declined triage. Disp. Time Disposition Final User 05/11/2020 7:02:35 AM General Information Provided Yes Brooke Pace Call Closed By: Brooke Pace Transaction Date/Time: 05/11/2020 6:59:31 AM (ET)

## 2020-05-11 NOTE — Patient Instructions (Addendum)
Labs today  I sent tramadol to your pharmacy for pain if needed   May consider prednisone   ? If possible infection vs temporal arteritis vs trigeminal neuralgia

## 2020-05-11 NOTE — Telephone Encounter (Signed)
Per appt notes pt already has appt scheduled with Dr Milinda Antis 05/11/20 at 11AM.

## 2020-05-12 ENCOUNTER — Encounter: Payer: Self-pay | Admitting: Family Medicine

## 2020-05-12 ENCOUNTER — Other Ambulatory Visit: Payer: Self-pay | Admitting: Family Medicine

## 2020-05-12 MED ORDER — PREDNISONE 10 MG PO TABS: ORAL_TABLET | ORAL | 0 refills | Status: DC

## 2020-05-12 NOTE — Telephone Encounter (Signed)
That sounds reasonable I pended in a prednisone taper to see if this helps-please send to preferred pharmacy Please keep me posted   Do check in with the dentist   Watch for a rash or any other new symptoms as we discussed previously

## 2020-05-12 NOTE — Telephone Encounter (Signed)
-----   Message from Shon Millet, [place] sent at 05/12/2020 10:11 AM EST ----- Pt notified of lab results and Dr. Royden Purl comments. Pt said he wants to hold off on MRI, he wants to discuss it with his wife an check insurance/ price. Pt also wants to see the dentist 1st to make sure the pain isn't coming from dental issues. Pt advise if and when he wants to proceed with MRI just call back and let us know. Pt did say the tramadol isn't helping at all and he is still in severe pain and wants to know what Dr. Milinda Antis can recommend/prescribe   [facility]

## 2020-05-12 NOTE — Telephone Encounter (Signed)
Pt notified of Dr. Royden Purl comments and that Rx was sent to pharmacy. Pt will keep [place] posted

## 2020-06-26 ENCOUNTER — Other Ambulatory Visit: Payer: Self-pay | Admitting: Cardiology

## 2020-06-26 DIAGNOSIS — E7800 Pure hypercholesterolemia, unspecified: Secondary | ICD-10-CM

## 2020-07-28 ENCOUNTER — Encounter: Payer: Self-pay | Admitting: Internal Medicine

## 2020-09-27 ENCOUNTER — Other Ambulatory Visit: Payer: Self-pay | Admitting: Cardiology

## 2021-01-14 ENCOUNTER — Telehealth: Payer: 59 | Admitting: Physician Assistant

## 2021-01-14 DIAGNOSIS — U071 COVID-19: Secondary | ICD-10-CM

## 2021-01-14 MED ORDER — MOLNUPIRAVIR EUA 200MG CAPSULE: 4.0000 | ORAL_CAPSULE | Freq: Two times a day (BID) | ORAL | 0 refills | Status: AC

## 2021-01-14 MED ORDER — BENZONATATE 100 MG PO CAPS: 100.0000 mg | ORAL_CAPSULE | Freq: Three times a day (TID) | ORAL | 0 refills | Status: DC | PRN

## 2021-01-14 MED ORDER — PROMETHAZINE-DM 6.25-15 MG/5ML PO SYRP: 5.0000 mL | ORAL_SOLUTION | Freq: Every day | ORAL | 0 refills | Status: DC

## 2021-01-14 MED ORDER — FLUTICASONE PROPIONATE 50 MCG/ACT NA SUSP: 2.0000 | Freq: Every day | NASAL | 0 refills | Status: DC

## 2021-01-14 NOTE — Patient Instructions (Signed)
Hello Daniel "[place]",  You are being placed in the home monitoring program for COVID-19 (commonly known as Coronavirus).  This is because you are suspected to have the virus or are known to have the virus.  If you are unsure which group you fall into call your clinic.    As part of this program, you'll answer a daily questionnaire in the MyChart mobile app. You'll receive a notification through the MyChart app when the questionnaire is available. When you log in to MyChart, you'll see the tasks in your To Do activity.       Clinicians will see any answers that are concerning and take appropriate steps.  If at any point you are having a medical emergency, call 911.  If otherwise concerned call your clinic instead of coming into the clinic or hospital.  To keep from spreading the disease you should: Stay home and limit contact with other people as much as possible.  Wash your hands frequently. Cover your coughs and sneezes with a tissue, and throw used tissues in the trash.   Clean and disinfect frequently touched surfaces and objects.    Take care of yourself by: Staying home Resting Drinking fluids Take fever-reducing medications (Tylenol/Acetaminophen and Ibuprofen)  For more information on the disease go to the Centers for Disease Control and Prevention website     You are being prescribed MOLNUPIRAVIR for COVID-19 infection.   Please call the pharmacy or go through the drive through vs going inside if you are picking up the mediation yourself to prevent further spread. If prescribed to a [facility] affiliated pharmacy, a pharmacist will bring the medication out to your car.   ADMINISTRATION INSTRUCTIONS: Take with or without food. Swallow the tablets whole. Don't chew, crush, or break the medications because it might not work as well  For each dose of the medication, you should be taking FOUR tablets at one time, TWICE a day   Finish your full five-day course of Molnupiravir  even if you feel better before you're done. Stopping this medication too early can make it less effective to prevent severe illness related to [place].    Molnupiravir is prescribed for YOU ONLY. Don't share it with others, even if they have similar symptoms as you. This medication might not be right for everyone.   Make sure to take steps to protect yourself and others while you're taking this medication in order to get well soon and to prevent others from getting sick with COVID-19.   **If you are of childbearing potential (any gender) - it is advised to not get pregnant while taking this medication and recommended that condoms are used for male partners the next 3 months after taking the medication out of extreme caution    COMMON SIDE EFFECTS: Diarrhea Nausea  Dizziness    If your COVID-19 symptoms get worse, get medical help right away. Call 911 if you experience symptoms such as worsening cough, trouble breathing, chest pain that doesn't go away, confusion, a hard time staying awake, and pale or blue-colored skin. This medication won't prevent all COVID-19 cases from getting worse.   Can take to lessen severity: Vit C '500mg'$  twice daily Quercertin 250-'500mg'$  twice daily Zinc 75-'100mg'$  daily Melatonin 3-6 mg at bedtime Vit D3 1000-2000 IU daily Aspirin 81 mg daily with food Optional: Famotidine '20mg'$  daily Also can add tylenol/ibuprofen as needed for fevers and body aches May add Mucinex or Mucinex DM as needed for cough/congestion  10 Things You Can  Do to Manage Your COVID-19 Symptoms at Home If you have possible or confirmed COVID-19 Stay home except to get medical care. Monitor your symptoms carefully. If your symptoms get worse, call your healthcare provider immediately. Get rest and stay hydrated. If you have a medical appointment, call the healthcare provider ahead of time and tell them that you have or may have COVID-19. For medical emergencies, call 911 and notify the  dispatch personnel that you have or may have COVID-19. Cover your cough and sneezes with a tissue or use the inside of your elbow. Wash your hands often with soap and water for at least 20 seconds or clean your hands with an alcohol-based hand sanitizer that contains at least 60% alcohol. As much as possible, stay in a specific room and away from other people in your home. Also, you should use a separate bathroom, if available. If you need to be around other people in or outside of the home, wear a mask. Avoid sharing personal items with other people in your household, like dishes, towels, and bedding. Clean all surfaces that are touched often, like counters, tabletops, and doorknobs. Use household cleaning sprays or wipes according to the label instructions. [email or web address] 11/29/2019 This information is not intended to replace advice given to you by your health care provider. Make sure you discuss any questions you have with your health care provider. Document Revised: 09/17/2020 Document Reviewed: 09/17/2020 Elsevier Patient Education  [place].

## 2021-01-14 NOTE — Progress Notes (Signed)
Virtual Visit Consent   Daniel Zimmerman, you are scheduled for a virtual visit with a [facility] provider today.     Just as with appointments in the office, your consent must be obtained to participate.  Your consent will be active for this visit and any virtual visit you may have with one of our providers in the next 365 days.     If you have a MyChart account, a copy of this consent can be sent to you electronically.  All virtual visits are billed to your insurance company just like a traditional visit in the office.    As this is a virtual visit, video technology does not allow for your provider to perform a traditional examination.  This may limit your provider's ability to fully assess your condition.  If your provider identifies any concerns that need to be evaluated in person or the need to arrange testing (such as labs, EKG, etc.), we will make arrangements to do so.     Although advances in technology are sophisticated, we cannot ensure that it will always work on either your end or our end.  If the connection with a video visit is poor, the visit may have to be switched to a telephone visit.  With either a video or telephone visit, we are not always able to ensure that we have a secure connection.     I need to obtain your verbal consent now.   Are you willing to proceed with your visit today?    Daniel Zimmerman has provided verbal consent on 01/14/2021 for a virtual visit (video or telephone).   Mar Daring, PA-C   Date: 01/14/2021 4:31 PM   Virtual Visit via Video Note   I, Mar Daring, connected with  Daniel Zimmerman  ([id number], 1961-03-31) on 01/14/21 at  4:00 PM EDT by a video-enabled telemedicine application and verified that I am speaking with the correct person using two identifiers.  Location: Patient: Virtual Visit Location Patient: Home Provider: Virtual Visit Location Provider: Home Office   I discussed the limitations of evaluation and management by  telemedicine and the availability of in person appointments. The patient expressed understanding and agreed to proceed.    History of Present Illness: Daniel Zimmerman is a 60 y.o. who identifies as a male who was assigned male at birth, and is being seen today for Covid 22.  HPI: URI  This is a new problem. Episode onset: tested positive for covid 19 today, symptoms started yesterday. The problem has been gradually worsening. The maximum temperature recorded prior to his arrival was 101 - 101.9 F. The fever has been present for Less than 1 day. Associated symptoms include congestion, coughing, diarrhea, headaches, nausea, a plugged ear sensation, rhinorrhea, sinus pain, a sore throat (scratchy) and wheezing. Pertinent negatives include no ear pain or vomiting. Associated symptoms comments: Fatigue, body aches, chills, post nasal drainage. Treatments tried: tylenol, ibuprofen, sinus medications. The treatment provided no relief.    Problems:  Patient Active Problem List   Diagnosis Date Noted   Facial pain 05/11/2020   Fatigue 05/02/2018   Insomnia 05/02/2018   Radiculopathy of lumbar region 05/02/2018   Irreducible umbilical hernia [id number]   Lower urinary tract symptoms (LUTS) 05/02/2018   HLD (hyperlipidemia) 05/01/2017   GERD (gastroesophageal reflux disease) 05/01/2017   Hypertension 06/01/2015   Prinzmetal's angina ([place]) 06/01/2015    Allergies:  Allergies  Allergen Reactions   Gabapentin Other (See Comments)  Severe asthma attack   Diltiazem Other (See Comments)    Edema   Celebrex [Celecoxib] Swelling   Penicillins     Was a baby when he had reaction--unknown   Medications:  Current Outpatient Medications:    benzonatate (TESSALON) 100 MG capsule, Take 1 capsule (100 mg total) by mouth 3 (three) times daily as needed., Disp: 30 capsule, Rfl: 0   fluticasone (FLONASE) 50 MCG/ACT nasal spray, Place 2 sprays into both nostrils daily., Disp: 16 g, Rfl: 0   molnupiravir EUA  200 mg CAPS, Take 4 capsules (800 mg total) by mouth 2 (two) times daily for 5 days., Disp: 40 capsule, Rfl: 0   promethazine-dextromethorphan (PROMETHAZINE-DM) 6.25-15 MG/5ML syrup, Take 5 mLs by mouth at bedtime., Disp: 118 mL, Rfl: 0   albuterol (PROVENTIL HFA;VENTOLIN HFA) 108 (90 Base) MCG/ACT inhaler, Inhale 2 puffs into the lungs every 6 (six) hours as needed., Disp: 1 Inhaler, Rfl: 0   atorvastatin (LIPITOR) 20 MG tablet, TAKE 1 TABLET BY MOUTH EVERY DAY, Disp: 90 tablet, Rfl: 0   cetirizine (ZYRTEC) 10 MG tablet, Take 10 mg by mouth daily., Disp: , Rfl:    famotidine (PEPCID) 20 MG tablet, TAKE 1 TABLET BY MOUTH TWICE A DAY, Disp: 180 tablet, Rfl: 1   hydrALAZINE (APRESOLINE) 25 MG tablet, TAKE 1 TABLET (25 MG TOTAL) BY MOUTH 3 (THREE) TIMES DAILY AS NEEDED (BP >140/80)., Disp: 270 tablet, Rfl: 1   L-Arginine 1000 MG TABS, Take by mouth 2 (two) times daily. , Disp: , Rfl:    Nebivolol HCl (BYSTOLIC) 20 MG TABS, Take 1 tablet (20 mg total) by mouth daily., Disp: 90 tablet, Rfl: 3   predniSONE (DELTASONE) 10 MG tablet, Take 4 pills once daily by mouth for 3 days, then 3 pills daily for 3 days, then 2 pills daily for 3 days then 1 pill daily for 3 days then stop, Disp: 30 tablet, Rfl: 0  Observations/Objective: Patient is well-developed, well-nourished in no acute distress.  Resting comfortably at home.  Head is normocephalic, atraumatic.  No labored breathing.  Speech is clear and coherent with logical content.  Patient is alert and oriented at baseline.    Assessment and Plan: 1. COVID-19 - molnupiravir EUA 200 mg CAPS; Take 4 capsules (800 mg total) by mouth 2 (two) times daily for 5 days.  Dispense: 40 capsule; Refill: 0 - benzonatate (TESSALON) 100 MG capsule; Take 1 capsule (100 mg total) by mouth 3 (three) times daily as needed.  Dispense: 30 capsule; Refill: 0 - promethazine-dextromethorphan (PROMETHAZINE-DM) 6.25-15 MG/5ML syrup; Take 5 mLs by mouth at bedtime.  Dispense: 118  mL; Refill: 0 - fluticasone (FLONASE) 50 MCG/ACT nasal spray; Place 2 sprays into both nostrils daily.  Dispense: 16 g; Refill: 0 - MyChart COVID-19 home monitoring program; Future - Continue OTC symptomatic management of choice - Will send OTC vitamins and supplement information through AVS - Molnupiravir prescribed - Tessalon perles, fluticasone and Promethazine DM prescribed - Use inhalers as prescribed for asthma - Patient enrolled in MyChart symptom monitoring - Push fluids - Rest as needed - Discussed return precautions and when to seek in-person evaluation, sent via AVS as well  Follow Up Instructions: I discussed the assessment and treatment plan with the patient. The patient was provided an opportunity to ask questions and all were answered. The patient agreed with the plan and demonstrated an understanding of the instructions.  A copy of instructions were sent to the patient via MyChart.  The patient was advised  to call back or seek an in-person evaluation if the symptoms worsen or if the condition fails to improve as anticipated.  Time:  I spent 15 minutes with the patient via telehealth technology discussing the above problems/concerns.    Mar Daring, PA-C

## 2021-02-09 ENCOUNTER — Other Ambulatory Visit: Payer: Self-pay | Admitting: Physician Assistant

## 2021-02-09 DIAGNOSIS — U071 COVID-19: Secondary | ICD-10-CM

## 2021-02-10 NOTE — Telephone Encounter (Signed)
From pt

## 2021-02-12 ENCOUNTER — Ambulatory Visit: Payer: BC Managed Care – PPO | Admitting: Cardiology

## 2021-05-22 ENCOUNTER — Other Ambulatory Visit: Payer: Self-pay | Admitting: Cardiology

## 2021-05-22 DIAGNOSIS — I10 Essential (primary) hypertension: Secondary | ICD-10-CM

## 2021-05-25 ENCOUNTER — Encounter: Payer: Self-pay | Admitting: Family Medicine

## 2021-05-25 ENCOUNTER — Ambulatory Visit (INDEPENDENT_AMBULATORY_CARE_PROVIDER_SITE_OTHER): Payer: 59 | Admitting: Family Medicine

## 2021-05-25 ENCOUNTER — Other Ambulatory Visit: Payer: Self-pay

## 2021-05-25 VITALS — BP 110/65 | HR 57 | Temp 97.8°F | Ht 69.6 in | Wt 206.4 lb

## 2021-05-25 DIAGNOSIS — I10 Essential (primary) hypertension: Secondary | ICD-10-CM | POA: Diagnosis not present

## 2021-05-25 DIAGNOSIS — Z125 Encounter for screening for malignant neoplasm of prostate: Secondary | ICD-10-CM | POA: Diagnosis not present

## 2021-05-25 DIAGNOSIS — E7800 Pure hypercholesterolemia, unspecified: Secondary | ICD-10-CM | POA: Diagnosis not present

## 2021-05-25 MED ORDER — ATORVASTATIN CALCIUM 20 MG PO TABS: 20.0000 mg | ORAL_TABLET | Freq: Every day | ORAL | 0 refills | Status: DC

## 2021-05-25 MED ORDER — NEBIVOLOL HCL 20 MG PO TABS: 20.0000 mg | ORAL_TABLET | Freq: Every day | ORAL | 0 refills | Status: DC

## 2021-05-25 NOTE — Assessment & Plan Note (Signed)
Stable, chronic.  Continue current medication.  Due for BMET eval.   Hydralazine 25 mg TID Nebivolol 20 mg daily

## 2021-05-25 NOTE — Progress Notes (Signed)
Patient ID: Daniel Zimmerman, male    DOB: 04-06-1961, 61 y.o.   MRN: [id number]  This visit was conducted in person.  BP 110/65    Pulse (!) 57    Temp 97.8 F (36.6 C) (Temporal)    Ht 5' 9.6" (1.768 m)    Wt 206 lb 6 oz (93.6 kg)    SpO2 97%    BMI 29.95 kg/m    CC: Chief Complaint  Patient presents with   Medication Refill    Subjective:   HPI: Daniel Zimmerman is a 61 y.o. male presenting on 05/25/2021 for Medication Refill  PCP Dr. Einar Zimmerman is out on maternity leave.   Overdue for CP with PCP  Hypertension:    Good control on current regimen. BP Readings from Last 3 Encounters:  05/25/21 110/65  05/11/20 134/80  02/12/20 115/74  Using medication without problems or lightheadedness:  none Chest pain with exertion: none Edema:   stable Short of breath: none Average home BPs: 115/70-80 Other issues: Cardiology in past Dr. Einar Zimmerman... released.  Elevated Cholesterol:  Due for re-eval. Lab Results  Component Value Date   CHOL 130 08/22/2019   HDL 39.60 08/22/2019   LDLCALC 70 08/22/2019   TRIG 100.0 08/22/2019   CHOLHDL 3 08/22/2019  Using medications without problems: Muscle aches:  Diet compliance:  moderate Exercise: walking 3-5 miles a day Other complaints:       Relevant past medical, surgical, family and social history reviewed and updated as indicated. Interim medical history since our last visit reviewed. Allergies and medications reviewed and updated. Outpatient Medications Prior to Visit  Medication Sig Dispense Refill   albuterol (PROVENTIL HFA;VENTOLIN HFA) 108 (90 Base) MCG/ACT inhaler Inhale 2 puffs into the lungs every 6 (six) hours as needed. 1 Inhaler 0   cetirizine (ZYRTEC) 10 MG tablet Take 10 mg by mouth daily.     famotidine (PEPCID) 20 MG tablet TAKE 1 TABLET BY MOUTH TWICE A DAY 180 tablet 1   hydrALAZINE (APRESOLINE) 25 MG tablet TAKE 1 TABLET (25 MG TOTAL) BY MOUTH 3 (THREE) TIMES DAILY AS NEEDED (BP >140/80). 270 tablet 1   L-Arginine  1000 MG TABS Take by mouth 2 (two) times daily.      Nebivolol HCl (BYSTOLIC) 20 MG TABS Take 1 tablet (20 mg total) by mouth daily. 90 tablet 3   atorvastatin (LIPITOR) 20 MG tablet TAKE 1 TABLET BY MOUTH EVERY DAY (Patient not taking: Reported on 05/25/2021) 90 tablet 0   benzonatate (TESSALON) 100 MG capsule Take 1 capsule (100 mg total) by mouth 3 (three) times daily as needed. 30 capsule 0   fluticasone (FLONASE) 50 MCG/ACT nasal spray Place 2 sprays into both nostrils daily. 16 g 0   predniSONE (DELTASONE) 10 MG tablet Take 4 pills once daily by mouth for 3 days, then 3 pills daily for 3 days, then 2 pills daily for 3 days then 1 pill daily for 3 days then stop 30 tablet 0   promethazine-dextromethorphan (PROMETHAZINE-DM) 6.25-15 MG/5ML syrup Take 5 mLs by mouth at bedtime. 118 mL 0   No facility-administered medications prior to visit.     Per HPI unless specifically indicated in ROS section below Review of Systems  Constitutional:  Negative for fatigue and fever.  HENT:  Negative for ear pain.   Eyes:  Negative for pain.  Respiratory:  Negative for cough and shortness of breath.   Cardiovascular:  Negative for chest pain, palpitations and leg swelling.  Gastrointestinal:  Negative for abdominal pain.  Genitourinary:  Negative for dysuria.  Musculoskeletal:  Negative for arthralgias.  Neurological:  Negative for syncope, light-headedness and headaches.  Psychiatric/Behavioral:  Negative for dysphoric mood.   Objective:  BP 110/65    Pulse (!) 57    Temp 97.8 F (36.6 C) (Temporal)    Ht 5' 9.6" (1.768 m)    Wt 206 lb 6 oz (93.6 kg)    SpO2 97%    BMI 29.95 kg/m   Wt Readings from Last 3 Encounters:  05/25/21 206 lb 6 oz (93.6 kg)  05/11/20 212 lb (96.2 kg)  02/12/20 214 lb (97.1 kg)      Physical Exam Constitutional:      Appearance: He is well-developed.  HENT:     Head: Normocephalic.     Right Ear: Hearing normal.     Left Ear: Hearing normal.     Nose: Nose normal.   Neck:     Thyroid: No thyroid mass or thyromegaly.     Vascular: No carotid bruit.     Trachea: Trachea normal.  Cardiovascular:     Rate and Rhythm: Normal rate and regular rhythm.     Pulses: Normal pulses.     Heart sounds: Heart sounds not distant. No murmur heard.   No friction rub. No gallop.     Comments: No peripheral edema Pulmonary:     Effort: Pulmonary effort is normal. No respiratory distress.     Breath sounds: Normal breath sounds.  Skin:    General: Skin is warm and dry.     Findings: No rash.  Psychiatric:        Speech: Speech normal.        Behavior: Behavior normal.        Thought Content: Thought content normal.      Results for orders placed or performed in visit on 05/11/20  CBC with Differential/Platelet  Result Value Ref Range   WBC 7.6 4.0 - 10.5 K/uL   RBC 5.39 4.22 - 5.81 Mil/uL   Hemoglobin 16.4 13.0 - 17.0 g/dL   HCT 47.7 39.0 - 52.0 %   MCV 88.6 78.0 - 100.0 fl   MCHC 34.3 30.0 - 36.0 g/dL   RDW 13.3 11.5 - 15.5 %   Platelets 226.0 150.0 - 400.0 K/uL   Neutrophils Relative % 62.6 43.0 - 77.0 %   Lymphocytes Relative 23.9 12.0 - 46.0 %   Monocytes Relative 7.6 3.0 - 12.0 %   Eosinophils Relative 4.9 0.0 - 5.0 %   Basophils Relative 1.0 0.0 - 3.0 %   Neutro Abs 4.8 1.4 - 7.7 K/uL   Lymphs Abs 1.8 0.7 - 4.0 K/uL   Monocytes Absolute 0.6 0.1 - 1.0 K/uL   Eosinophils Absolute 0.4 0.0 - 0.7 K/uL   Basophils Absolute 0.1 0.0 - 0.1 K/uL  Basic metabolic panel  Result Value Ref Range   Sodium 136 135 - 145 mEq/L   Potassium 5.2 (H) 3.5 - 5.1 mEq/L   Chloride 104 96 - 112 mEq/L   CO2 26 19 - 32 mEq/L   Glucose, Bld 99 70 - 99 mg/dL   BUN 11 6 - 23 mg/dL   Creatinine, Ser 1.04 0.40 - 1.50 mg/dL   GFR 79.25 >60.00 mL/min   Calcium 8.9 8.4 - 10.5 mg/dL  Sedimentation Rate  Result Value Ref Range   Sed Rate 10 0 - 20 mm/hr  C-reactive protein  Result Value Ref Range   CRP <  1.0 0.5 - 20.0 mg/dL    This visit occurred during the  SARS-CoV-2 public health emergency.  Safety protocols were in place, including screening questions prior to the visit, additional usage of staff PPE, and extensive cleaning of exam room while observing appropriate contact time as indicated for disinfecting solutions.   COVID 19 screen:  No recent travel or known exposure to COVID19 The patient denies respiratory symptoms of COVID 19 at this time. The importance of social distancing was discussed today.   Assessment and Plan    Problem List Items Addressed This Visit     HLD (hyperlipidemia)   Relevant Medications   atorvastatin (LIPITOR) 20 MG tablet   Nebivolol HCl (BYSTOLIC) 20 MG TABS   Other Relevant Orders   Lipid panel   Comprehensive metabolic panel   Hypertension - Primary    Stable, chronic.  Continue current medication.  Due for BMET eval.   Hydralazine 25 mg TID Nebivolol 20 mg daily       Relevant Medications   atorvastatin (LIPITOR) 20 MG tablet   Nebivolol HCl (BYSTOLIC) 20 MG TABS   Other Visit Diagnoses     Essential hypertension       Relevant Medications   atorvastatin (LIPITOR) 20 MG tablet   Nebivolol HCl (BYSTOLIC) 20 MG TABS   Prostate cancer screening       Relevant Orders   PSA      Meds ordered this encounter  Medications   atorvastatin (LIPITOR) 20 MG tablet    Sig: Take 1 tablet (20 mg total) by mouth daily.    Dispense:  90 tablet    Refill:  0   Nebivolol HCl (BYSTOLIC) 20 MG TABS    Sig: Take 1 tablet (20 mg total) by mouth daily.    Dispense:  90 tablet    Refill:  0    approved  from 05/11/2020 through 05/10/2023.       Eliezer Lofts, MD

## 2021-06-01 ENCOUNTER — Other Ambulatory Visit: Payer: Self-pay

## 2021-06-01 ENCOUNTER — Encounter: Payer: Self-pay | Admitting: Family Medicine

## 2021-06-01 ENCOUNTER — Other Ambulatory Visit (INDEPENDENT_AMBULATORY_CARE_PROVIDER_SITE_OTHER): Payer: 59

## 2021-06-01 DIAGNOSIS — Z125 Encounter for screening for malignant neoplasm of prostate: Secondary | ICD-10-CM

## 2021-06-01 DIAGNOSIS — E7800 Pure hypercholesterolemia, unspecified: Secondary | ICD-10-CM

## 2021-06-01 DIAGNOSIS — I10 Essential (primary) hypertension: Secondary | ICD-10-CM

## 2021-06-01 LAB — COMPREHENSIVE METABOLIC PANEL
ALT: 33 U/L (ref 0–53)
AST: 20 U/L (ref 0–37)
Albumin: 4.2 g/dL (ref 3.5–5.2)
Alkaline Phosphatase: 57 U/L (ref 39–117)
BUN: 9 mg/dL (ref 6–23)
CO2: 27 mEq/L (ref 19–32)
Calcium: 9 mg/dL (ref 8.4–10.5)
Chloride: 105 mEq/L (ref 96–112)
Creatinine, Ser: 1.03 mg/dL (ref 0.40–1.50)
GFR: 79.59 mL/min (ref >60.00)
Glucose, Bld: 116 mg/dL — ABNORMAL HIGH (ref 70–99)
Potassium: 4.3 mEq/L (ref 3.5–5.1)
Sodium: 140 mEq/L (ref 135–145)
Total Bilirubin: 0.5 mg/dL (ref 0.2–1.2)
Total Protein: 6.6 g/dL (ref 6.0–8.3)

## 2021-06-01 LAB — LIPID PANEL
Cholesterol: 205 mg/dL — ABNORMAL HIGH (ref 0–200)
HDL: 40.3 mg/dL (ref >39.00)
LDL Cholesterol: 138 mg/dL — ABNORMAL HIGH (ref 0–99)
NonHDL: 165.07
Total CHOL/HDL Ratio: 5
Triglycerides: 133 mg/dL (ref 0.0–149.0)
VLDL: 26.6 mg/dL (ref 0.0–40.0)

## 2021-06-01 LAB — PSA: PSA: 1.18 ng/mL (ref 0.10–4.00)

## 2021-06-01 MED ORDER — ATORVASTATIN CALCIUM 20 MG PO TABS: 20.0000 mg | ORAL_TABLET | Freq: Every day | ORAL | 0 refills | Status: DC

## 2021-06-01 MED ORDER — NEBIVOLOL HCL 20 MG PO TABS: 20.0000 mg | ORAL_TABLET | Freq: Every day | ORAL | 0 refills | Status: DC

## 2021-06-15 ENCOUNTER — Ambulatory Visit (INDEPENDENT_AMBULATORY_CARE_PROVIDER_SITE_OTHER): Payer: 59 | Admitting: Family Medicine

## 2021-06-15 ENCOUNTER — Ambulatory Visit (INDEPENDENT_AMBULATORY_CARE_PROVIDER_SITE_OTHER)
Admission: RE | Admit: 2021-06-15 | Discharge: 2021-06-15 | Disposition: A | Discharge status: Home / Self Care | Payer: 59 | Source: Ambulatory Visit | Attending: Family Medicine | Admitting: Family Medicine

## 2021-06-15 ENCOUNTER — Other Ambulatory Visit: Payer: Self-pay

## 2021-06-15 ENCOUNTER — Encounter: Payer: Self-pay | Admitting: Family Medicine

## 2021-06-15 VITALS — BP 100/70 | HR 72 | Temp 97.6°F | Ht 69.6 in | Wt 207.2 lb

## 2021-06-15 DIAGNOSIS — M542 Cervicalgia: Secondary | ICD-10-CM | POA: Diagnosis not present

## 2021-06-15 DIAGNOSIS — M25562 Pain in left knee: Secondary | ICD-10-CM | POA: Insufficient documentation

## 2021-06-15 DIAGNOSIS — S0991XA Unspecified injury of ear, initial encounter: Secondary | ICD-10-CM | POA: Insufficient documentation

## 2021-06-15 DIAGNOSIS — M25561 Pain in right knee: Secondary | ICD-10-CM | POA: Diagnosis not present

## 2021-06-15 DIAGNOSIS — S01312A Laceration without foreign body of left ear, initial encounter: Secondary | ICD-10-CM | POA: Insufficient documentation

## 2021-06-15 DIAGNOSIS — S060X0A Concussion without loss of consciousness, initial encounter: Secondary | ICD-10-CM | POA: Insufficient documentation

## 2021-06-15 MED ORDER — DICLOFENAC SODIUM 75 MG PO TBEC: 75.0000 mg | DELAYED_RELEASE_TABLET | Freq: Two times a day (BID) | ORAL | 0 refills | Status: DC

## 2021-06-15 MED ORDER — CYCLOBENZAPRINE HCL 10 MG PO TABS: 10.0000 mg | ORAL_TABLET | Freq: Every evening | ORAL | 0 refills | Status: DC | PRN

## 2021-06-15 NOTE — Assessment & Plan Note (Signed)
No injury to tympanic membrane.  External ear swollen.. treat with diclofenac and ice prn.

## 2021-06-15 NOTE — Patient Instructions (Addendum)
Start total brain rest for concussion.  Advance activity as discussed once headache resolved.  We will call with X-ray results of neck. Apply neosporin to laceration at ear and  wash with warm soapy water daily.  If negative, start neck and low back stretching.  Can use muscle relaxant and antiinflammatory as needed for  knee pain, back pain and muscle spasm. Go to ER if severe headache or new neurologic symptoms.

## 2021-06-15 NOTE — Assessment & Plan Note (Signed)
Given vertebral pain... eval for fracture with X-ray.
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
## 2021-06-15 NOTE — Assessment & Plan Note (Signed)
Treat with ice and NSAIDs.

## 2021-06-15 NOTE — Assessment & Plan Note (Addendum)
No red flags for intracranial hemorrhage. Start total brain rest for concussion. Advance activity as discussed once headache resolved.  ER precautions given.

## 2021-06-15 NOTE — Assessment & Plan Note (Signed)
Healing well.  Apply neosporin to laceration at ear and  wash with warm soapy water daily.

## 2021-07-11 ENCOUNTER — Other Ambulatory Visit: Payer: Self-pay | Admitting: Family Medicine

## 2021-07-11 NOTE — Telephone Encounter (Signed)
Both last written 06-15-21  Last OV Acute 06-15-21 No Future OV [facility]

## 2021-07-12 MED ORDER — DICLOFENAC SODIUM 75 MG PO TBEC: 75.0000 mg | DELAYED_RELEASE_TABLET | Freq: Two times a day (BID) | ORAL | 0 refills | Status: AC

## 2021-07-12 MED ORDER — CYCLOBENZAPRINE HCL 10 MG PO TABS: 10.0000 mg | ORAL_TABLET | Freq: Every evening | ORAL | 0 refills | Status: DC | PRN

## 2021-08-10 ENCOUNTER — Ambulatory Visit (INDEPENDENT_AMBULATORY_CARE_PROVIDER_SITE_OTHER): Payer: 59 | Admitting: Family Medicine

## 2021-08-10 ENCOUNTER — Other Ambulatory Visit: Payer: Self-pay

## 2021-08-10 VITALS — BP 110/72 | HR 60 | Temp 97.9°F | Wt 205.2 lb

## 2021-08-10 DIAGNOSIS — N410 Acute prostatitis: Secondary | ICD-10-CM | POA: Diagnosis not present

## 2021-08-10 DIAGNOSIS — M19042 Primary osteoarthritis, left hand: Secondary | ICD-10-CM | POA: Insufficient documentation

## 2021-08-10 DIAGNOSIS — M1990 Unspecified osteoarthritis, unspecified site: Secondary | ICD-10-CM

## 2021-08-10 DIAGNOSIS — R300 Dysuria: Secondary | ICD-10-CM | POA: Diagnosis not present

## 2021-08-10 DIAGNOSIS — R399 Unspecified symptoms and signs involving the genitourinary system: Secondary | ICD-10-CM | POA: Diagnosis not present

## 2021-08-10 LAB — POC URINALSYSI DIPSTICK (AUTOMATED)
Bilirubin, UA: NEGATIVE
Blood, UA: NEGATIVE
Glucose, UA: NEGATIVE
Ketones, UA: NEGATIVE
Leukocytes, UA: NEGATIVE
Nitrite, UA: NEGATIVE
Protein, UA: NEGATIVE
Spec Grav, UA: 1.025 (ref 1.010–1.025)
Urobilinogen, UA: 0.2 E.U./dL
pH, UA: 6 (ref 5.0–8.0)

## 2021-08-10 MED ORDER — CIPROFLOXACIN HCL 500 MG PO TABS: 500.0000 mg | ORAL_TABLET | Freq: Two times a day (BID) | ORAL | 0 refills | Status: AC

## 2021-08-10 NOTE — Patient Instructions (Signed)
Prostate infection ?- Take antibiotic for 6 weeks ?- if no improvement after 2 weeks let me know and we can plan for urology referral  ?

## 2021-08-10 NOTE — Assessment & Plan Note (Signed)
Hand arthritis improved with diclofenac. Discussed risk for CKD and heart disease with regular use. Discussed prn use would be acceptable. He declined refill as he felt he would not take only as needed. F/u prn for worsening hand pain ?

## 2021-08-10 NOTE — Assessment & Plan Note (Signed)
Pt with hx of prostatitis with similar presentation. UA bland, but will send for Culture. Low suspicion for UCx to result. Will treat with Cipro 500 mg bid x 6 weeks. Discussed risk for tendon injury. Urology if symptoms not improving in 2 weeks.  ?

## 2021-08-10 NOTE — Progress Notes (Signed)
? ?Subjective:  ? ?  ?Daniel Zimmerman is a 61 y.o. male presenting for Dysuria (Worse over the last couple months ) and Urinary Retention ?  ? ? ?HPI ? ?#Urinary retention ?- has been told he has an enlarged prostate ?- saw urology and never went back  ?- hx of prostatitis before which resolved with antibiotics - very painful exam at that time ?- burning with urination ?- difficulty emptying the bladder ?- recent road trip to [place] - had to make several stops  ?- years since his last infection ? ?Review of Systems ? ? ?Social History  ? ?Tobacco Use  ?Smoking Status Former  ? Packs/day: 0.75  ? Years: 20.00  ? Pack years: 15.00  ? Types: Cigarettes  ? Quit date: 10/03/2003  ? Years since quitting: 17.8  ?Smokeless Tobacco Never  ?Tobacco Comments  ? quit 10 years  ? ? ? ?   ?Objective:  ?  ?BP Readings from Last 3 Encounters:  ?08/10/21 110/72  ?06/15/21 100/70  ?05/25/21 110/65  ? ?Wt Readings from Last 3 Encounters:  ?08/10/21 205 lb 3 oz (93.1 kg)  ?06/15/21 207 lb 4 oz (94 kg)  ?05/25/21 206 lb 6 oz (93.6 kg)  ? ? ?BP 110/72   Pulse 60   Temp 97.9 ?F (36.6 ?C) (Oral)   Wt 205 lb 3 oz (93.1 kg)   SpO2 98%   BMI 29.78 kg/m?  ? ? ?Physical Exam ?Exam conducted with a chaperone present.  ?Constitutional:   ?   Appearance: Normal appearance. He is not ill-appearing or diaphoretic.  ?HENT:  ?   Right Ear: External ear normal.  ?   Left Ear: External ear normal.  ?Eyes:  ?   General: No scleral icterus. ?   Extraocular Movements: Extraocular movements intact.  ?   Conjunctiva/sclera: Conjunctivae normal.  ?Cardiovascular:  ?   Rate and Rhythm: Normal rate.  ?Pulmonary:  ?   Effort: Pulmonary effort is normal.  ?Genitourinary: ?   Prostate: Tender (Extremely painful with exam. Only able to partially insert digit.).  ?Musculoskeletal:  ?   Cervical back: Neck supple.  ?Skin: ?   General: Skin is warm and dry.  ?Neurological:  ?   Mental Status: He is alert. Mental status is at baseline.  ?Psychiatric:     ?   Mood  and Affect: Mood normal.  ? ? ?UA: neg nitrites, neg LE ? ? ? ? ?   ?Assessment & Plan:  ? ?Problem List Items Addressed This Visit   ? ?  ? Musculoskeletal and Integument  ? Arthritis  ?  Hand arthritis improved with diclofenac. Discussed risk for CKD and heart disease with regular use. Discussed prn use would be acceptable. He declined refill as he felt he would not take only as needed. F/u prn for worsening hand pain ?  ?  ?  ? Genitourinary  ? Prostatitis, acute - Primary  ?  Pt with hx of prostatitis with similar presentation. UA bland, but will send for Culture. Low suspicion for UCx to result. Will treat with Cipro 500 mg bid x 6 weeks. Discussed risk for tendon injury. Urology if symptoms not improving in 2 weeks.  ?  ?  ? Relevant Medications  ? ciprofloxacin (CIPRO) 500 MG tablet  ? Other Relevant Orders  ? Urine Culture  ?  ? Other  ? Lower urinary tract symptoms (LUTS)  ? ?Other Visit Diagnoses   ? ? Dysuria      ?  Relevant Orders  ? POCT Urinalysis Dipstick (Automated) (Completed)  ? ?  ? ? ? ?Return if symptoms worsen or fail to improve. ? ?Lesleigh Noe, MD ? ?This visit occurred during the SARS-CoV-2 public health emergency.  Safety protocols were in place, including screening questions prior to the visit, additional usage of staff PPE, and extensive cleaning of exam room while observing appropriate contact time as indicated for disinfecting solutions.  ? ?

## 2021-08-11 LAB — URINE CULTURE
MICRO NUMBER:: 13189890
Result:: NO GROWTH
SPECIMEN QUALITY:: ADEQUATE

## 2021-08-24 ENCOUNTER — Ambulatory Visit (INDEPENDENT_AMBULATORY_CARE_PROVIDER_SITE_OTHER): Payer: 59 | Admitting: Family Medicine

## 2021-08-24 VITALS — BP 120/60 | HR 57 | Temp 98.0°F | Ht 69.5 in | Wt 201.1 lb

## 2021-08-24 DIAGNOSIS — Z0000 Encounter for general adult medical examination without abnormal findings: Secondary | ICD-10-CM

## 2021-08-24 DIAGNOSIS — I201 Angina pectoris with documented spasm: Secondary | ICD-10-CM

## 2021-08-24 DIAGNOSIS — E7800 Pure hypercholesterolemia, unspecified: Secondary | ICD-10-CM | POA: Diagnosis not present

## 2021-08-24 DIAGNOSIS — I10 Essential (primary) hypertension: Secondary | ICD-10-CM

## 2021-08-24 DIAGNOSIS — R7309 Other abnormal glucose: Secondary | ICD-10-CM | POA: Diagnosis not present

## 2021-08-24 DIAGNOSIS — R7303 Prediabetes: Secondary | ICD-10-CM

## 2021-08-24 LAB — POCT GLYCOSYLATED HEMOGLOBIN (HGB A1C): Hemoglobin A1C: 5.7 % — AB (ref 4.0–5.6)

## 2021-08-24 MED ORDER — NEBIVOLOL HCL 20 MG PO TABS: 20.0000 mg | ORAL_TABLET | Freq: Every day | ORAL | 3 refills | Status: DC

## 2021-08-24 MED ORDER — ATORVASTATIN CALCIUM 40 MG PO TABS: 40.0000 mg | ORAL_TABLET | Freq: Every day | ORAL | 1 refills | Status: DC

## 2021-08-24 NOTE — Assessment & Plan Note (Signed)
Stable. Cont nebivolol 20 mg daily.  ?

## 2021-08-24 NOTE — Progress Notes (Signed)
Annual Exam  ? ?Chief Complaint:  ?Chief Complaint  ?Patient presents with  ? Annual Exam  ?  No concerns  ? ? ?History of Present Illness:  ?Daniel Zimmerman is a 61 y.o. presents today for annual examination.   ? ? ?Nutrition/Lifestyle ?Diet: some stress but also weight loss ?Exercise: playing with grandchildren, golf in the arm weather ?He is not sexually active ?Any issues with getting or keeping erection? No ? ?Social History  ? ?Tobacco Use  ?Smoking Status Former  ? Packs/day: 0.75  ? Years: 20.00  ? Pack years: 15.00  ? Types: Cigarettes  ? Quit date: 10/03/2003  ? Years since quitting: 17.9  ?Smokeless Tobacco Never  ?Tobacco Comments  ? quit 10 years  ? ?Social History  ? ?Substance and Sexual Activity  ?Alcohol Use Yes  ? Alcohol/week: 0.0 standard drinks  ? Comment: once a month  ? ?Social History  ? ?Substance and Sexual Activity  ?Drug Use No  ? ? ? ?Safety ?The patient wears seatbelts: yes.     ?The patient feels safe at home and in their relationships: yes. ? ?General Health ?Dentist in the last year: No ?Eye doctor: yes ? ?Weight ?Wt Readings from Last 3 Encounters:  ?08/24/21 201 lb 2 oz (91.2 kg)  ?08/10/21 205 lb 3 oz (93.1 kg)  ?06/15/21 207 lb 4 oz (94 kg)  ? ?Patient has high BMI  ?BMI Readings from Last 1 Encounters:  ?08/24/21 29.28 kg/m?  ? ? ? ?Chronic disease screening ?Blood pressure monitoring:  ?BP Readings from Last 3 Encounters:  ?08/24/21 120/60  ?08/10/21 110/72  ?06/15/21 100/70  ? ? ?Lipid Monitoring: Indication for screening: age >35, obesity, diabetes, family hx, CV risk factors.  ?Lipid screening: Yes ? ?Lab Results  ?Component Value Date  ? CHOL 205 (H) 06/01/2021  ? HDL 40.30 06/01/2021  ? LDLCALC 138 (H) 06/01/2021  ? TRIG 133.0 06/01/2021  ? CHOLHDL 5 06/01/2021  ? ? ? ?Diabetes Screening: age >47, overweight, family hx, PCOS, hx of gestational diabetes, at risk ethnicity, elevated blood pressure >135/80.  ?Diabetes Screening screening: Yes ? ?Lab Results  ?Component Value  Date  ? HGBA1C 5.7 (A) 08/24/2021  ? ? ? ?Prostate Cancer Screening: Yes ?Age 74-69 yo Shared Decision Making ?Higher Risk: Older age, African American, Family Hx of Prostate Cancer - Yes ?Benefits: screening may prevent 1.3 deaths from prostate cancer over 13 years per 1000 men screened and prevent 3 metastatic cases per 1000 men screened. Not enough evidence to support more benefit for AA or [facility] ?Harms: False Positive and psychological harms. 15% of me with false positive over a 2 to 4 year period > resulting in biopsy and complications such as pain, hematospermia, infections. Overdiagnosis - increases with age - found that 20-50% of prostate cancer through screening may have never caused any issues. Harms of treatment include - erectile dysfunction, urinary incontinence, and bothersome bowel symptoms.  ? ?After discussion he does want to get a PSA checked today.  ? ?Inadequate evidence for screening <55 ?No mortality benefit for screening >70  ? ?Lab Results  ?Component Value Date  ? PSA 1.18 06/01/2021  ? PSA 0.87 05/02/2018  ? PSA 0.95 05/01/2017  ? ? ? ? ? ?Colon Cancer Screening:  ?Age 65-75 yo - benefits outweigh the risk. Adults 62-85 yo who have never been screened benefit.  ?Benefits: 134000 people in 2016 will be diagnosed and 49,000 will die - early detection helps ?Harms: Complications 2/2 to colonoscopy ?  High Risk (Colonoscopy): genetic disorder (Lynch syndrome or familial adenomatous polyposis), personal hx of IBD, previous adenomatous polyp, or previous colorectal cancer, FamHx start 10 years before the age at diagnosis, increased in males and black race ? ?Options:  ?FIT - looks for hemoglobin (blood in the stool) - specific and fairly sensitive - must be done annually ?Cologuard - looks for DNA and blood - more sensitive - therefore can have more false positives, every 3 years ?Colonoscopy - every 10 years if normal - sedation, bowl prep, must have someone drive you ? ?Shared decision making and  the patient had decided to do colonoscopy - due now, is waiting for insurance issues. ? ? ?Social History  ? ?Tobacco Use  ?Smoking Status Former  ? Packs/day: 0.75  ? Years: 20.00  ? Pack years: 15.00  ? Types: Cigarettes  ? Quit date: 10/03/2003  ? Years since quitting: 17.9  ?Smokeless Tobacco Never  ?Tobacco Comments  ? quit 10 years  ? ? ?Lung Cancer Screening (Ages 22-97): not applicable ? ? ?Abdominal Aortic Aneurysm:  ?Age 54-75, 1 time screening, men who have ever smoked not old enough ? ? ? ?Past Medical History:  ?Diagnosis Date  ? Allergy   ? Allergy-induced asthma   ? in past  ? Arthritis   ? Chicken pox   ? Colon polyps 02/11/2014  ? Sessile serrated polyps x 2  ? GERD (gastroesophageal reflux disease)   ? Heart disease   ? Hyperlipidemia   ? Hypertension   ? ? ?Past Surgical History:  ?Procedure Laterality Date  ? ROTATOR CUFF REPAIR Right   ? SPINE SURGERY    ? Cervical--ruptured disc  ? UMBILICAL HERNIA REPAIR  2019  ? WRIST SURGERY Left   ? ? ?Prior to Admission medications   ?Medication Sig Start Date End Date Taking? Authorizing Provider  ?albuterol (PROVENTIL HFA;VENTOLIN HFA) 108 (90 Base) MCG/ACT inhaler Inhale 2 puffs into the lungs every 6 (six) hours as needed. 08/23/18  Yes Lucille Passy, MD  ?atorvastatin (LIPITOR) 20 MG tablet Take 1 tablet (20 mg total) by mouth daily. 06/01/21  Yes Bedsole, Amy E, MD  ?cetirizine (ZYRTEC) 10 MG tablet Take 10 mg by mouth daily.   Yes [provider]  ?ciprofloxacin (CIPRO) 500 MG tablet Take 1 tablet (500 mg total) by mouth 2 (two) times daily. 08/10/21 09/21/21 Yes Lesleigh Noe, MD  ?diclofenac (VOLTAREN) 75 MG EC tablet Take 1 tablet (75 mg total) by mouth 2 (two) times daily. 07/12/21  Yes Bedsole, Amy E, MD  ?famotidine (PEPCID) 20 MG tablet TAKE 1 TABLET BY MOUTH TWICE A DAY 02/15/19  Yes Lucille Passy, MD  ?hydrALAZINE (APRESOLINE) 25 MG tablet TAKE 1 TABLET (25 MG TOTAL) BY MOUTH 3 (THREE) TIMES DAILY AS NEEDED (BP >140/80). 09/28/20  Yes  Adrian Prows, MD  ?L-Arginine 1000 MG TABS Take by mouth 2 (two) times daily.    Yes [provider]  ?Nebivolol HCl (BYSTOLIC) 20 MG TABS Take 1 tablet (20 mg total) by mouth daily. 06/01/21  Yes Jinny Sanders, MD  ? ? ?Allergies  ?Allergen Reactions  ? Gabapentin Other (See Comments)  ?  Severe asthma attack  ? Diltiazem Other (See Comments)  ?  Edema  ? Celebrex [Celecoxib] Swelling  ? Penicillins   ?  Was a baby when he had reaction--unknown  ? ? ? ?Social History  ? ?Socioeconomic History  ? Marital status: Married  ?  Spouse name: Eustaquio Maize  ?  Number of children: 2  ? Years of education: some college  ? Highest education level: Not on file  ?Occupational History  ? Not on file  ?Tobacco Use  ? Smoking status: Former  ?  Packs/day: 0.75  ?  Years: 20.00  ?  Pack years: 15.00  ?  Types: Cigarettes  ?  Quit date: 10/03/2003  ?  Years since quitting: 17.9  ? Smokeless tobacco: Never  ? Tobacco comments:  ?  quit 10 years  ?Vaping Use  ? Vaping Use: Never used  ?Substance and Sexual Activity  ? Alcohol use: Yes  ?  Alcohol/week: 0.0 standard drinks  ?  Comment: once a month  ? Drug use: No  ? Sexual activity: Yes  ?  Birth control/protection: Post-menopausal  ?Other Topics Concern  ? Not on file  ?Social History Narrative  ? 08/22/19  ? From: the area  ? Living: wife Beth [phone]) and daughter at home  ? Work: Health and safety inspector for a [employer]  ?   ? Family: Brooke and Lexi - adult - 3 grandchildren  ?   ? Enjoys: golf  ?   ? Exercise: stationary bike - 5-10 minutes per night  ? Diet: not great - eats what he wants - avoids salt, caffeine  ?   ? Safety  ? Seat belts: Yes   ? Guns: Yes  and secure  ? Safe in relationships: Yes   ? ?Social Determinants of Health  ? ?Financial Resource Strain: Not on file  ?Food Insecurity: Not on file  ?Transportation Needs: Not on file  ?Physical Activity: Not on file  ?Stress: Not on file  ?Social Connections: Not on file  ?Intimate Partner Violence: Not on file  ? ? ?Family  History  ?Problem Relation Age of Onset  ? Arthritis Mother   ? Hyperlipidemia Mother   ? Hypertension Mother   ? Skin cancer Mother   ?     unknown  ? Arthritis Father   ? COPD Father   ? Hypertension Mater

## 2021-08-24 NOTE — Assessment & Plan Note (Signed)
Lab Results  ?Component Value Date  ? LDLCALC 138 (H) 06/01/2021  ? ?Increase atovastatin 20>40 mg. Repeat fasting in 3 months ?

## 2021-08-24 NOTE — Patient Instructions (Addendum)
Keep working on [employer] and exercise ? ?Return in 3 months or when insurance for fasting lipids ? ?Consider getting Tetanus and Shingles vaccine ? ? ?

## 2021-11-15 ENCOUNTER — Other Ambulatory Visit: Payer: Self-pay | Admitting: Primary Care

## 2021-11-15 DIAGNOSIS — E7800 Pure hypercholesterolemia, unspecified: Secondary | ICD-10-CM

## 2021-11-23 ENCOUNTER — Other Ambulatory Visit (INDEPENDENT_AMBULATORY_CARE_PROVIDER_SITE_OTHER): Payer: 59

## 2021-11-23 DIAGNOSIS — E7800 Pure hypercholesterolemia, unspecified: Secondary | ICD-10-CM

## 2021-11-23 LAB — LIPID PANEL
Cholesterol: 127 mg/dL (ref 0–200)
HDL: 37.7 mg/dL — ABNORMAL LOW (ref >39.00)
LDL Cholesterol: 64 mg/dL (ref 0–99)
NonHDL: 89.32
Total CHOL/HDL Ratio: 3
Triglycerides: 125 mg/dL (ref 0.0–149.0)
VLDL: 25 mg/dL (ref 0.0–40.0)

## 2022-02-11 ENCOUNTER — Other Ambulatory Visit: Payer: Self-pay | Admitting: Family Medicine

## 2022-02-11 DIAGNOSIS — E7800 Pure hypercholesterolemia, unspecified: Secondary | ICD-10-CM

## 2022-03-29 ENCOUNTER — Telehealth: Payer: Self-pay

## 2022-03-29 ENCOUNTER — Ambulatory Visit: Payer: 59 | Admitting: Nurse Practitioner

## 2022-03-29 ENCOUNTER — Encounter: Payer: Self-pay | Admitting: Nurse Practitioner

## 2022-03-29 VITALS — BP 108/62 | HR 70 | Temp 98.0°F | Resp 14 | Ht 69.5 in | Wt 207.1 lb

## 2022-03-29 DIAGNOSIS — R0600 Dyspnea, unspecified: Secondary | ICD-10-CM | POA: Diagnosis not present

## 2022-03-29 DIAGNOSIS — R051 Acute cough: Secondary | ICD-10-CM

## 2022-03-29 LAB — POC COVID19 BINAXNOW: SARS Coronavirus 2 Ag: NEGATIVE

## 2022-03-29 MED ORDER — GUAIFENESIN-CODEINE 100-10 MG/5ML PO SOLN: 5.0000 mL | Freq: Three times a day (TID) | ORAL | 0 refills | Status: AC | PRN

## 2022-03-29 MED ORDER — PREDNISONE 20 MG PO TABS: ORAL_TABLET | ORAL | 0 refills | Status: AC

## 2022-03-29 NOTE — Telephone Encounter (Signed)
[name] - Client Nonclinical Telephone Record  [facility] Client [facility] Night - Client Client Site [place] - Night Provider Eliezer Lofts - MD Contact Type Call Who Is Calling Patient / Member / Family / Caregiver Caller Name [place] Phone Number [phone] Patient Name Daniel Zimmerman Patient DOB [birth year] Call Type Message Only Information Provided Reason for Call Request to Schedule Office Appointment Initial Comment Caller needing to schedule a sick appt for her spouse, patient refused triage but needs to be seen for his asthma Patient request to speak to RN No Additional Comment Caller states they can go to [facility] or [place], which ever has appts for today Disp. Time Disposition Final User 03/29/2022 7:31:47 AM General Information Provided Yes Kenton Kingfisher, Lanette Call Closed By: Nelia Shi Transaction Date/Time: 03/29/2022 7:26:32 AM (ET

## 2022-03-29 NOTE — Assessment & Plan Note (Signed)
COVID test negative in office.  Will write patient codeine-guaifenesin cough medication 5 mils 3 times daily as needed sedation precautions reviewed.

## 2022-03-29 NOTE — Telephone Encounter (Signed)
Noted. Patient was seen and evaluated in office today by me

## 2022-03-29 NOTE — Progress Notes (Signed)
Acute Office Visit  Subjective:     Patient ID: Daniel Zimmerman, male    DOB: Aug 02, 1960, 61 y.o.   MRN: [id number]  Chief Complaint  Patient presents with   Cough    Sx started around 03/26/22-Cough worse especially at night, wheezing, sinus drainage, scratchy throat. No fever. Has not tired anything additional OTC remedies aside of what he already takes      Patient is in today for Cough  Symptoms started appr 3-4 days ago. States that his allergies get bad around thanksgiving.  States that he have been albuterol with out relief and propping uping that does not help. No sick contatcts Covid test was negative   States that he uses a navage and it aggrevatated the sinus cavities   Review of Systems  Constitutional:  Positive for malaise/fatigue. Negative for chills and fever.       Appetite is normal Fluid intake is normal. He drinks several cups of coffee during the day   HENT:  Positive for ear pain (pop sometimes) and sore throat (scratcy). Negative for sinus pain.   Respiratory:  Positive for cough and sputum production.   Cardiovascular:  Negative for chest pain.  Musculoskeletal:  Negative for joint pain and myalgias.  Neurological:  Negative for headaches.        Objective:    BP 108/62   Pulse 70   Temp 98 F (36.7 C)   Resp 14   Ht 5' 9.5" (1.765 m)   Wt 207 lb 2 oz (94 kg)   SpO2 97%   BMI 30.15 kg/m    Physical Exam Vitals and nursing note reviewed.  Constitutional:      Appearance: Normal appearance.  HENT:     Right Ear: Tympanic membrane, ear canal and external ear normal.     Left Ear: Ear canal and external ear normal.     Ears:     Comments: Clear fluid behind left TM    Mouth/Throat:     Mouth: Mucous membranes are moist.     Pharynx: Oropharynx is clear.  Cardiovascular:     Rate and Rhythm: Normal rate and regular rhythm.     Heart sounds: Normal heart sounds.  Pulmonary:     Effort: Pulmonary effort is normal.     Breath sounds:  Normal breath sounds.  Lymphadenopathy:     Cervical: No cervical adenopathy.  Neurological:     Mental Status: He is alert.     Results for orders placed or performed in visit on 03/29/22  POC COVID-19  Result Value Ref Range   SARS Coronavirus 2 Ag Negative Negative        Assessment & Plan:   Problem List Items Addressed This Visit       Other   Acute cough - Primary    COVID test negative in office.  Will write patient codeine-guaifenesin cough medication 5 mils 3 times daily as needed sedation precautions reviewed.      Relevant Medications   guaiFENesin-codeine 100-10 MG/5ML syrup   Other Relevant Orders   POC COVID-19 (Completed)   Dyspnea    Will write patient prednisone 20 mg taper as prescribed.  Prednisone precautions reviewed.  If no improvement follow-up in office      Relevant Medications   predniSONE (DELTASONE) 20 MG tablet    Meds ordered this encounter  Medications   predniSONE (DELTASONE) 20 MG tablet    Sig: Take 1 tablet (20 mg total) by mouth  2 (two) times daily with a meal for 3 days, THEN 1 tablet (20 mg total) daily with breakfast for 3 days. Do not take with NSAIDs like: ibuprofen, aleve, naproxen, BC/Goody powders.    Dispense:  9 tablet    Refill:  0    Order Specific Question:   Supervising Provider    Answer:   TOWER, MARNE A [[id number]]   guaiFENesin-codeine 100-10 MG/5ML syrup    Sig: Take 5 mLs by mouth 3 (three) times daily as needed for up to 8 days for cough.    Dispense:  120 mL    Refill:  0    Order Specific Question:   Supervising Provider    Answer:   Loura Pardon A [[id number]]    Return if symptoms worsen or fail to improve.  Romilda Garret, NP

## 2022-03-29 NOTE — Patient Instructions (Signed)
Nice to see you today I have sent in cough medication that can make you sleepy and some steroids.  Follow up if you do not start improving over the next several days with these medications

## 2022-03-29 NOTE — Assessment & Plan Note (Signed)
Will write patient prednisone 20 mg taper as prescribed.  Prednisone precautions reviewed.  If no improvement follow-up in office

## 2022-03-29 NOTE — Telephone Encounter (Signed)
I spoke with pts wife (DPR signed)pt has chest congestion, dry cough, some wheeze,no fever, albuterol inhaler not helping. Pts wife said change of season does this. No CP or SOB.not sure when symptoms started. Pt has not cked covid test and does not have covid test to ck. Pts wife said they just want an appt. Scheduled appt with Romilda Garret NP on 03/29/22 at 11 AM with UC & ED precautions which pts wife voiced understanding. Sent note to Romilda Garret NP and [facility].

## 2022-04-02 ENCOUNTER — Encounter: Payer: Self-pay | Admitting: Nurse Practitioner

## 2022-04-04 MED ORDER — AZITHROMYCIN 250 MG PO TABS: ORAL_TABLET | ORAL | 0 refills | Status: AC

## 2022-06-10 ENCOUNTER — Encounter: Payer: Self-pay | Admitting: Family Medicine

## 2022-06-10 ENCOUNTER — Ambulatory Visit: Payer: 59 | Admitting: Family Medicine

## 2022-06-10 ENCOUNTER — Telehealth: Payer: Self-pay

## 2022-06-10 VITALS — BP 118/86 | HR 58 | Temp 96.9°F | Ht 69.5 in | Wt 202.2 lb

## 2022-06-10 DIAGNOSIS — K625 Hemorrhage of anus and rectum: Secondary | ICD-10-CM | POA: Insufficient documentation

## 2022-06-10 DIAGNOSIS — I10 Essential (primary) hypertension: Secondary | ICD-10-CM | POA: Diagnosis not present

## 2022-06-10 DIAGNOSIS — J4530 Mild persistent asthma, uncomplicated: Secondary | ICD-10-CM | POA: Diagnosis not present

## 2022-06-10 MED ORDER — FLUTICASONE PROPIONATE HFA 110 MCG/ACT IN AERO: 2.0000 | INHALATION_SPRAY | Freq: Every day | RESPIRATORY_TRACT | 12 refills | Status: DC

## 2022-06-10 MED ORDER — PREDNISONE 20 MG PO TABS
ORAL_TABLET | ORAL | 0 refills | Status: AC
Start: 2022-06-10 — End: 2022-06-26

## 2022-06-10 MED ORDER — HYDRALAZINE HCL 25 MG PO TABS: 25.0000 mg | ORAL_TABLET | Freq: Three times a day (TID) | ORAL | 3 refills | Status: DC | PRN

## 2022-06-10 NOTE — Assessment & Plan Note (Signed)
Symptoms do sound consistent with an asthma exacerbation., though I cannot rule out underlying COPD. I reviewed appropriate inhaler technique to improve its efficacy. I will place him on a tapering course of prednisone. I will also start him on a Flovent inhaler. He should follow-up in 2-3 weeks.

## 2022-06-10 NOTE — Assessment & Plan Note (Signed)
Requests refill of hydralazine today. Blood pressure is in acceptable control.

## 2022-06-10 NOTE — Progress Notes (Signed)
[facility] [address] [place] [address] Dept: [phone] Dept Fax: [phone]  Office Visit  Subjective:    Patient ID: Daniel Zimmerman, male    DOB: 07-30-1960, 62 y.o..   MRN: [id number]  Chief Complaint  Patient presents with   Acute Visit    C/o having worsening cough, wheezing since having Bronchitis at Thanksgiving.  Cough drops only.     History of Present Illness:  Patient is in today complaining of ongoing respiratory issues. He notes that the has bronchitis in mid-November. He was treated with a course of prednisone and a Z-pack. He had some improvement, but notes really he has continued to have cough and wheezing ever since then. He had a history of childhood asthma. He has a past history of a 15-20 pack-year history of smoking, but quit about 25 years ago. He has an albuterol inhaler, but feels it is not necessarily helping much. He has been using his wife's albuterol nebulizer, but is having to repeat this every 3-4 hours. The cough is worse at night. His home oxygen level has been decreasing to 90-92% at home.  Past Medical History: Patient Active Problem List   Diagnosis Date Noted   Rectal bleeding 06/10/2022   Acute cough 03/29/2022   Dyspnea 03/29/2022   Prediabetes 08/24/2021   Prostatitis, acute 08/10/2021   Arthritis 08/10/2021   Acute neck pain 06/15/2021   Acute pain of both knees 06/15/2021   Left ear injury, initial encounter 06/15/2021   Concussion with no loss of consciousness 06/15/2021   Facial pain 05/11/2020   Fatigue 05/02/2018   Insomnia 05/02/2018   Radiculopathy of lumbar region 05/02/2018   Irreducible umbilical hernia 70/35/0093   Lower urinary tract symptoms (LUTS) 05/02/2018   HLD (hyperlipidemia) 05/01/2017   GERD (gastroesophageal reflux disease) 05/01/2017   Essential hypertension 06/01/2015   Prinzmetal's angina ([place]) 06/01/2015   Past Surgical History:  Procedure  Laterality Date   ROTATOR CUFF REPAIR Right    SPINE SURGERY     Cervical--ruptured disc   UMBILICAL HERNIA REPAIR  2019   WRIST SURGERY Left    Family History  Problem Relation Age of Onset   Arthritis Mother    Hyperlipidemia Mother    Hypertension Mother    Skin cancer Mother        unknown   Arthritis Father    COPD Father    Hypertension Maternal Uncle    Arthritis Maternal Grandmother    Hypertension Maternal Grandmother    Heart attack Maternal Grandmother 74   Arthritis Maternal Grandfather    Arthritis Paternal Grandmother    Heart disease Paternal Grandmother    Arthritis Paternal Grandfather    Prostate cancer Paternal Grandfather 46   Outpatient Medications Prior to Visit  Medication Sig Dispense Refill   albuterol (PROVENTIL HFA;VENTOLIN HFA) 108 (90 Base) MCG/ACT inhaler Inhale 2 puffs into the lungs every 6 (six) hours as needed. 1 Inhaler 0   atorvastatin (LIPITOR) 40 MG tablet Take 1 tablet by mouth once daily 90 tablet 1   cetirizine (ZYRTEC) 10 MG tablet Take 10 mg by mouth daily.     diclofenac (VOLTAREN) 75 MG EC tablet Take 1 tablet (75 mg total) by mouth 2 (two) times daily. 30 tablet 0   famotidine (PEPCID) 20 MG tablet TAKE 1 TABLET BY MOUTH TWICE A DAY 180 tablet 1   L-Arginine 1000 MG TABS Take by mouth 2 (two) times daily.  Nebivolol HCl (BYSTOLIC) 20 MG TABS Take 1 tablet (20 mg total) by mouth daily. 90 tablet 3   hydrALAZINE (APRESOLINE) 25 MG tablet TAKE 1 TABLET (25 MG TOTAL) BY MOUTH 3 (THREE) TIMES DAILY AS NEEDED (BP >140/80). 270 tablet 1   No facility-administered medications prior to visit.   Allergies  Allergen Reactions   Gabapentin Other (See Comments)    Severe asthma attack   Diltiazem Other (See Comments)    Edema   Celebrex [Celecoxib] Swelling   Penicillins     Was a baby when he had reaction--unknown   Objective:   Today's Vitals   06/10/22 1025  BP: 118/86  Pulse: (!) 58  Temp: (!) 96.9 F (36.1 C)  TempSrc:  Temporal  SpO2: 95%  Weight: 202 lb 3.2 oz (91.7 kg)  Height: 5' 9.5" (1.765 m)   Body mass index is 29.43 kg/m.   General: Well developed, well nourished. No acute distress. HEENT: Normocephalic, non-traumatic. Conjunctiva clear. External ears normal. EAC and TMs   normal bilaterally. Nose clear without congestion or rhinorrhea. Mucous membranes moist.   Oropharynx clear. Good dentition. Neck: Supple. No lymphadenopathy. No thyromegaly. Lungs: Mild end-expiratory wheezing in the bases. No rales or rhonchi. CV: RRR without murmurs or rubs. Pulses 2+ bilaterally. Psych: Alert and oriented. Normal mood and affect.  Health Maintenance Due  Topic Date Due   DTaP/Tdap/Td (1 - Tdap) Never done   Zoster Vaccines- Shingrix (1 of 2) Never done   COLONOSCOPY (Pts 45-51yr Insurance coverage will need to be confirmed)  06/04/2020     Assessment & Plan:   Problem List Items Addressed This Visit       Cardiovascular and Mediastinum   Essential hypertension    Requests refill of hydralazine today. Blood pressure is in acceptable control.      Relevant Medications   hydrALAZINE (APRESOLINE) 25 MG tablet     Respiratory   Mild persistent asthma - Primary    Symptoms do sound consistent with an asthma exacerbation., though I cannot rule out underlying COPD. I reviewed appropriate inhaler technique to improve its efficacy. I will place him on a tapering course of prednisone. I will also start him on a Flovent inhaler. He should follow-up in 2-3 weeks.      Relevant Medications   predniSONE (DELTASONE) 20 MG tablet   fluticasone (FLOVENT HFA) 110 MCG/ACT inhaler   Return in about 3 weeks (around 07/01/2022) for Reassessment and Transfer of Care.   SHaydee Salter MD

## 2022-06-10 NOTE — Telephone Encounter (Signed)
[facility] Night - Client Nonclinical Telephone Record  [facility] Client [facility] Night - Client Client Site [place] - Night Provider Eliezer Lofts - MD Contact Type Call Who Is Calling Patient / Member / Family / Caregiver Caller Name Tera Partridge Phone Number [phone] Patient Name Daniel Zimmerman Patient DOB Apr 10, 1961 Call Type Message Only Information Provided Reason for Call Request for General Office Information Initial Comment Pt made an apt online and he never got a confirmation. Additional Comment Please call back. Disp. Time Disposition Final User 06/10/2022 7:48:22 AM General Information Provided Yes Paul Dykes Call Closed By: Paul Dykes Transaction Date/Time: 06/10/2022 7:46:12 AM (ET

## 2022-06-10 NOTE — Telephone Encounter (Signed)
LVM for patient to call back. ?

## 2022-06-12 ENCOUNTER — Encounter: Payer: Self-pay | Admitting: Family Medicine

## 2022-06-12 DIAGNOSIS — J4530 Mild persistent asthma, uncomplicated: Secondary | ICD-10-CM

## 2022-06-13 ENCOUNTER — Ambulatory Visit: Payer: 59 | Admitting: Internal Medicine

## 2022-06-13 MED ORDER — ALBUTEROL SULFATE HFA 108 (90 BASE) MCG/ACT IN AERS: 2.0000 | INHALATION_SPRAY | Freq: Four times a day (QID) | RESPIRATORY_TRACT | 0 refills | Status: DC | PRN

## 2022-06-13 MED ORDER — BUDESONIDE-FORMOTEROL FUMARATE 80-4.5 MCG/ACT IN AERO: 2.0000 | INHALATION_SPRAY | Freq: Two times a day (BID) | RESPIRATORY_TRACT | 12 refills | Status: DC

## 2022-07-01 ENCOUNTER — Encounter: Payer: Self-pay | Admitting: Internal Medicine

## 2022-07-01 ENCOUNTER — Encounter: Payer: Self-pay | Admitting: Family Medicine

## 2022-07-01 ENCOUNTER — Ambulatory Visit: Payer: 59 | Admitting: Family Medicine

## 2022-07-01 VITALS — BP 118/70 | HR 59 | Temp 97.9°F | Ht 69.5 in | Wt 207.4 lb

## 2022-07-01 DIAGNOSIS — K625 Hemorrhage of anus and rectum: Secondary | ICD-10-CM | POA: Diagnosis not present

## 2022-07-01 DIAGNOSIS — I201 Angina pectoris with documented spasm: Secondary | ICD-10-CM | POA: Diagnosis not present

## 2022-07-01 DIAGNOSIS — J4530 Mild persistent asthma, uncomplicated: Secondary | ICD-10-CM

## 2022-07-01 DIAGNOSIS — Z7689 Persons encountering health services in other specified circumstances: Secondary | ICD-10-CM

## 2022-07-01 DIAGNOSIS — Z1211 Encounter for screening for malignant neoplasm of colon: Secondary | ICD-10-CM

## 2022-07-01 DIAGNOSIS — Z23 Encounter for immunization: Secondary | ICD-10-CM

## 2022-07-01 DIAGNOSIS — I10 Essential (primary) hypertension: Secondary | ICD-10-CM

## 2022-07-01 DIAGNOSIS — R399 Unspecified symptoms and signs involving the genitourinary system: Secondary | ICD-10-CM | POA: Diagnosis not present

## 2022-07-01 DIAGNOSIS — E782 Mixed hyperlipidemia: Secondary | ICD-10-CM

## 2022-07-01 DIAGNOSIS — J3089 Other allergic rhinitis: Secondary | ICD-10-CM | POA: Insufficient documentation

## 2022-07-01 LAB — PSA: PSA: 1.63 ng/mL (ref 0.10–4.00)

## 2022-07-01 MED ORDER — TAMSULOSIN HCL 0.4 MG PO CAPS: 0.4000 mg | ORAL_CAPSULE | Freq: Every day | ORAL | 3 refills | Status: DC

## 2022-07-01 NOTE — Assessment & Plan Note (Signed)
Last lipids were at goal. Continue atorvastatin 40 mg daily.

## 2022-07-01 NOTE — Assessment & Plan Note (Signed)
Blood pressure is in good control. Continue hydralazine 25 mg daily and nebivolol 20 mg daily.

## 2022-07-01 NOTE — Assessment & Plan Note (Signed)
By history, it sounds like he has anal fissures. He is due for a repeat colonoscopy, so this should be evaluated at the time.

## 2022-07-01 NOTE — Assessment & Plan Note (Signed)
Stable by history. Continue hydralazine 25 mg daily and nebivolol 20 mg daily.

## 2022-07-01 NOTE — Assessment & Plan Note (Signed)
Stable when using his inhalers. Continue Symbicort and PRN albuterol.

## 2022-07-01 NOTE — Progress Notes (Signed)
[facility] [address] [place] [address] Dept: [phone] Dept Fax: [phone]  Transfer of Care Office Visit  Subjective:    Patient ID: Daniel Zimmerman, male    DOB: [birth year], 62 y.o..   MRN: [id number]  Chief Complaint  Patient presents with   Establish Care    [facility]- establish care. Not fasting today.  ? prostrate     History of Present Illness:  Patient is in today to establish care. Daniel Zimmerman was born in [place], [place]. He has lived in this area for all but 10 years of his life, when he lived near [place], [place]. He has worked in [employer] thorughout his career. 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 Daniel Zimmerman has been married for 38 years. He has 2 daughters (38, 61) and 5 grandchildren. He quit tobacco use abotu 15 years ago, haivng smoked about 1 ppd for 30 years. He occasionally drinks alcohol and denies drug use.  Daniel Zimmerman has a history of essential hypertension and Prinzmetal angina. He is managed on hydralazine 25 mg daily, nebivolol 20 mg daily and takes an L-arginine supplement.  Daniel Zimmerman has a history of mild, persistent asthma. He is managed on Symbicort and PRN albuterol. He also has some associated perennial allergies. He takes Zyrtec daily.  Daniel Zimmerman had a history of low back pain and multiple joint aches. he manages with diclofenac 75 mg daily as needed.  Daniel Zimmerman has a history of episodic rectal bleeding. he describes this as occurring after he has large bowel movements. He has had colonoscopies in the past. He notes if he takes a fiber supplement, he tends to get more of a diarrhea response.  Daniel Zimmerman notes issues with urinary frequency. He gets up about every hour at night. He also does have to strain to start his stream. He has a past history of chronic prostatitis. he was treated with a 6 week course of Cipro last spring. He denies any pain in  his prostate area currently.  Past Medical History: Patient Active Problem List   Diagnosis Date Noted   Rectal bleeding 06/10/2022   Mild persistent asthma 06/10/2022   Dyspnea 03/29/2022   Prediabetes 08/24/2021   Arthritis 08/10/2021   Left ear injury, initial encounter 06/15/2021   Concussion with no loss of consciousness 06/15/2021   Fatigue 05/02/2018   Radiculopathy of lumbar region 05/02/2018   Irreducible umbilical hernia [id number]   Lower urinary tract symptoms (LUTS) 05/02/2018   Hyperlipidemia 05/01/2017   GERD (gastroesophageal reflux disease) 05/01/2017   Essential hypertension 06/01/2015   Prinzmetal's angina ([place]) 06/01/2015   Past Surgical History:  Procedure Laterality Date   ROTATOR CUFF REPAIR Right    SPINE SURGERY     Cervical--ruptured disc   UMBILICAL HERNIA REPAIR  2019   WRIST SURGERY Left    Family History  Problem Relation Age of Onset   Cancer Mother        Kidney   Arthritis Mother    Hyperlipidemia Mother    Hypertension Mother    Skin cancer Mother        unknown   Arthritis Father    COPD Father    Heart attack Maternal Uncle    Hypertension Maternal Uncle    Arthritis Maternal Grandmother    Hypertension Maternal Grandmother    Heart attack Maternal Grandmother 77   Arthritis Maternal Grandfather    Arthritis Paternal Grandmother  Heart disease Paternal Grandmother    Arthritis Paternal Grandfather    Prostate cancer Paternal Grandfather 69   Outpatient Medications Prior to Visit  Medication Sig Dispense Refill   albuterol (VENTOLIN HFA) 108 (90 Base) MCG/ACT inhaler Inhale 2 puffs into the lungs every 6 (six) hours as needed. 1 each 0   atorvastatin (LIPITOR) 40 MG tablet Take 1 tablet by mouth once daily 90 tablet 1   budesonide-formoterol (SYMBICORT) 80-4.5 MCG/ACT inhaler Inhale 2 puffs into the lungs 2 (two) times daily. 1 each 12   cetirizine (ZYRTEC) 10 MG tablet Take 10 mg by mouth daily.     diclofenac (VOLTAREN)  75 MG EC tablet Take 1 tablet (75 mg total) by mouth 2 (two) times daily. 30 tablet 0   famotidine (PEPCID) 20 MG tablet TAKE 1 TABLET BY MOUTH TWICE A DAY 180 tablet 1   hydrALAZINE (APRESOLINE) 25 MG tablet Take 1 tablet (25 mg total) by mouth 3 (three) times daily as needed (BP >140/80). 270 tablet 3   L-Arginine 1000 MG TABS Take by mouth 2 (two) times daily.      Nebivolol HCl (BYSTOLIC) 20 MG TABS Take 1 tablet (20 mg total) by mouth daily. 90 tablet 3   No facility-administered medications prior to visit.   Allergies  Allergen Reactions   Gabapentin Other (See Comments)    Severe asthma attack   Diltiazem Other (See Comments)    Edema   Celebrex [Celecoxib] Swelling   Penicillins     Was a baby when he had reaction--unknown      Objective:   Today's Vitals   07/01/22 1034  BP: 118/70  Pulse: (!) 59  Temp: 97.9 F (36.6 C)  TempSrc: Temporal  SpO2: 96%  Weight: 207 lb 6.4 oz (94.1 kg)  Height: 5' 9.5" (1.765 m)   Body mass index is 30.19 kg/m.   General: Well developed, well nourished. No acute distress. Psych: Alert and oriented. Normal mood and affect.  Health Maintenance Due  Topic Date Due   Zoster Vaccines- Shingrix (1 of 2) Never done   COLONOSCOPY (Pts 45-13yr Insurance coverage will need to be confirmed)  06/04/2020     Assessment & Plan:   Problem List Items Addressed This Visit       Cardiovascular and Mediastinum   Essential hypertension - Primary    Blood pressure is in good control. Continue hydralazine 25 mg daily and nebivolol 20 mg daily.       Prinzmetal's angina (HCC)    Stable by history. Continue hydralazine 25 mg daily and nebivolol 20 mg daily.         Respiratory   Mild persistent asthma    Stable when using his inhalers. Continue Symbicort and PRN albuterol.        Digestive   Rectal bleeding    By history, it sounds like he has anal fissures. He is due for a repeat colonoscopy, so this should be evaluated at the  time.        Other   Hyperlipidemia    Last lipids were at goal. Continue atorvastatin 40 mg daily.      Lower urinary tract symptoms (LUTS)    LUTS are consistent with prostate hypertrophy. I will check a PSA. I recommend we start Flomax daily.      Relevant Medications   tamsulosin (FLOMAX) 0.4 MG CAPS capsule   Other Relevant Orders   PSA   Other Visit Diagnoses     Screening for  colon cancer       Relevant Orders   Ambulatory referral to Gastroenterology   Need for Tdap vaccination       Relevant Orders   Tdap vaccine greater than or equal to 7yo IM (Completed)       Return in about 3 months (around 09/29/2022) for Reassessment.   Haydee Salter, MD

## 2022-07-01 NOTE — Assessment & Plan Note (Signed)
LUTS are consistent with prostate hypertrophy. I will check a PSA. I recommend we start Flomax daily.

## 2022-07-04 ENCOUNTER — Other Ambulatory Visit: Payer: Self-pay | Admitting: Family Medicine

## 2022-07-04 DIAGNOSIS — J4530 Mild persistent asthma, uncomplicated: Secondary | ICD-10-CM

## 2022-07-22 ENCOUNTER — Encounter: Payer: Self-pay | Admitting: Internal Medicine

## 2022-07-22 ENCOUNTER — Ambulatory Visit (AMBULATORY_SURGERY_CENTER): Payer: 59

## 2022-07-22 VITALS — Ht 71.0 in | Wt 205.0 lb

## 2022-07-22 DIAGNOSIS — Z86010 Personal history of colonic polyps: Secondary | ICD-10-CM

## 2022-07-22 MED ORDER — NA SULFATE-K SULFATE-MG SULF 17.5-3.13-1.6 GM/177ML PO SOLN: 1.0000 | Freq: Once | ORAL | 0 refills | Status: AC

## 2022-07-22 NOTE — Progress Notes (Signed)
No egg or soy allergy known to patient  No issues known to pt with past sedation with any surgeries or procedures - pt reports waking up angry  Patient denies ever being told they had issues or difficulty with intubation  No FH of Malignant Hyperthermia Pt is not on diet pills Pt is not on  home 02  Pt is not on blood thinners  Pt denies issues with constipation  No A fib or A flutter Have any cardiac testing pending-- no Pt instructed to use Singlecare.com or GoodRx for a price reduction on prep   Patient's chart reviewed by Osvaldo Angst CNRA prior to previsit and patient appropriate for the [facility].  Previsit completed and red dot placed by patient's name on their procedure day (on provider's schedule).

## 2022-08-09 ENCOUNTER — Other Ambulatory Visit: Payer: Self-pay

## 2022-08-09 DIAGNOSIS — E7800 Pure hypercholesterolemia, unspecified: Secondary | ICD-10-CM

## 2022-08-09 MED ORDER — ATORVASTATIN CALCIUM 40 MG PO TABS: 40.0000 mg | ORAL_TABLET | Freq: Every day | ORAL | 3 refills | Status: DC

## 2022-08-19 ENCOUNTER — Encounter: Payer: Self-pay | Admitting: Internal Medicine

## 2022-08-19 ENCOUNTER — Ambulatory Visit (AMBULATORY_SURGERY_CENTER): Payer: 59 | Admitting: Internal Medicine

## 2022-08-19 VITALS — BP 102/68 | HR 63 | Temp 97.1°F | Resp 10 | Ht 69.0 in | Wt 201.8 lb

## 2022-08-19 DIAGNOSIS — Z86010 Personal history of colonic polyps: Secondary | ICD-10-CM

## 2022-08-19 DIAGNOSIS — Z09 Encounter for follow-up examination after completed treatment for conditions other than malignant neoplasm: Secondary | ICD-10-CM

## 2022-08-19 DIAGNOSIS — D120 Benign neoplasm of cecum: Secondary | ICD-10-CM

## 2022-08-19 DIAGNOSIS — Z0389 Encounter for observation for other suspected diseases and conditions ruled out: Secondary | ICD-10-CM

## 2022-08-19 MED ORDER — SODIUM CHLORIDE 0.9 % IV SOLN
500.0000 mL | Freq: Once | INTRAVENOUS | Status: DC
Start: 2022-08-19 — End: 2022-08-19

## 2022-08-19 NOTE — Progress Notes (Signed)
Vss nad trans to pacu 

## 2022-08-19 NOTE — Progress Notes (Signed)
Called to room to assist during endoscopic procedure.  Patient ID and intended procedure confirmed with present staff. Received instructions for my participation in the procedure from the performing physician.  

## 2022-08-19 NOTE — Op Note (Signed)
[facility] Patient Name: Daniel PertRaymond Flight Procedure Date: 08/19/2022 10:27 AM MRN: [id number] Endoscopist: Wilhemina BonitoJohn N. Marina GoodellPerry , MD, [phone] [id number] Age: [age] Referring MD:  Date of Birth: 10/08/1960 Gender: Male Account #: [id number] Procedure:                Colonoscopy with cold snare polypectomy x 1 Indications:              High risk colon cancer surveillance: Personal                            history of sessile serrated colon polyp (less than                            10 mm in size) with no dysplasia. Previous                            examinations 2015, 2017 Medicines:                Monitored Anesthesia Care Procedure:                Pre-Anesthesia Assessment:                           - Prior to the procedure, a History and Physical                            was performed, and patient medications and                            allergies were reviewed. The patient's tolerance of                            previous anesthesia was also reviewed. The risks                            and benefits of the procedure and the sedation                            options and risks were discussed with the patient.                            All questions were answered, and informed consent                            was obtained. Prior Anticoagulants: The patient has                            taken no anticoagulant or antiplatelet agents. ASA                            Grade Assessment: II - A patient with mild systemic                            disease. After reviewing the risks and benefits,  the patient was deemed in satisfactory condition to                            undergo the procedure.                           After obtaining informed consent, the colonoscope                            was passed under direct vision. Throughout the                            procedure, the patient's blood pressure, pulse, and                            oxygen  saturations were monitored continuously. The                            CF [id number] #[id number] was introduced through the anus                            and advanced to the the cecum, identified by                            appendiceal orifice and ileocecal valve. The                            ileocecal valve, appendiceal orifice, and rectum                            were photographed. The quality of the bowel                            preparation was good. The colonoscopy was performed                            without difficulty. The patient tolerated the                            procedure well. The bowel preparation used was                            SUPREP via split dose instruction. Scope In: 10:40:36 AM Scope Out: 10:53:22 AM Scope Withdrawal Time: 0 hours 10 minutes 46 seconds  Total Procedure Duration: 0 hours 12 minutes 46 seconds  Findings:                 A 1 mm polyp was found in the cecum. The polyp was                            removed with a cold snare. Resection and retrieval                            were complete.  Multiple diverticula were found in the left colon.                           The exam was otherwise without abnormality on                            direct and retroflexion views. Hypertrophic anal                            papilla noted. Complications:            No immediate complications. Estimated blood loss:                            None. Estimated Blood Loss:     Estimated blood loss: none. Impression:               - One 1 mm polyp in the cecum, removed with a cold                            snare. Resected and retrieved.                           - Diverticulosis in the left colon.                           - The examination was otherwise normal on direct                            and retroflexion views. Hypertrophic anal papilla                            noted. Recommendation:           - Repeat colonoscopy in  7-10 years for surveillance.                           - Patient has a contact number available for                            emergencies. The signs and symptoms of potential                            delayed complications were discussed with the                            patient. Return to normal activities tomorrow.                            Written discharge instructions were provided to the                            patient.                           - Resume previous diet.                           -  Continue present medications.                           - Await pathology results. Wilhemina Bonito. Marina Goodell, MD 08/19/2022 11:00:22 AM This report has been signed electronically.

## 2022-08-19 NOTE — Progress Notes (Signed)
Pt's states no medical or surgical changes since previsit or office visit. VS assessed by D.T 

## 2022-08-19 NOTE — Progress Notes (Signed)
HISTORY OF PRESENT ILLNESS:  Daniel Zimmerman is a 62 y.o. male presents today for surveillance colonoscopy.  Sessile serrated polyp in 2015.  Most recent colonoscopy 2017 was done to evaluate abnormalities on CT.  The exam was negative for neoplasia.  No complaints  REVIEW OF SYSTEMS:  All non-GI ROS negative except for  Past Medical History:  Diagnosis Date   Allergy    Allergy-induced asthma    in past   Arthritis    Chicken pox    Colon polyps 02/11/2014   Sessile serrated polyps x 2   GERD (gastroesophageal reflux disease)    Heart disease    Hyperlipidemia    Hypertension     Past Surgical History:  Procedure Laterality Date   COLONOSCOPY     ROTATOR CUFF REPAIR Right    SPINE SURGERY     Cervical--ruptured disc   UMBILICAL HERNIA REPAIR  2019   WRIST SURGERY Left     Social History Daniel Zimmerman  reports that he quit smoking about 18 years ago. His smoking use included cigarettes. He has a 15.00 pack-year smoking history. He has never used smokeless tobacco. He reports current alcohol use. He reports that he does not use drugs.  family history includes Arthritis in his father, maternal grandfather, maternal grandmother, mother, paternal grandfather, and paternal grandmother; COPD in his father; Cancer in his mother; Heart attack in his maternal uncle; Heart attack (age of onset: 51) in his maternal grandmother; Heart disease in his paternal grandmother; Hyperlipidemia in his mother; Hypertension in his maternal grandmother, maternal uncle, and mother; Prostate cancer (age of onset: 59) in his paternal grandfather; Skin cancer in his mother.  Allergies  Allergen Reactions   Gabapentin Other (See Comments)    Severe asthma attack   Diltiazem Other (See Comments)    Edema   Celebrex [Celecoxib] Swelling   Penicillins     Was a baby when he had reaction--unknown       PHYSICAL EXAMINATION: Vital signs: BP 114/83   Pulse (!) 56   Temp (!) 97.1 F (36.2 C)  (Skin)   Resp 19   Ht 5\' 9"  (1.753 m)   Wt 201 lb 12.8 oz (91.5 kg)   SpO2 96%   BMI 29.80 kg/m  General: Well-developed, well-nourished, no acute distress HEENT: Sclerae are anicteric, conjunctiva pink. Oral mucosa intact Lungs: Clear Heart: Regular Abdomen: soft, nontender, nondistended, no obvious ascites, no peritoneal signs, normal bowel sounds. No organomegaly. Extremities: No edema Psychiatric: alert and oriented x3. Cooperative     ASSESSMENT:  History of sessile serrated polyp   PLAN:   Surveillance colonoscopy

## 2022-08-19 NOTE — Patient Instructions (Addendum)
Recommendation:- Repeat colonoscopy in 7-10 years for surveillance.                           - Patient has a contact number available for                            emergencies. The signs and symptoms of potential                            delayed complications were discussed with the                            patient. Return to normal activities tomorrow.                            Written discharge instructions were provided to the                            patient.                           - Resume previous diet.                           - Continue present medications.                           - Await pathology results.  Handouts on polyps and diverticulosis given.  YOU HAD AN ENDOSCOPIC PROCEDURE TODAY AT THE [facility]:   Refer to the procedure report that was given to you for any specific questions about what was found during the examination.  If the procedure report does not answer your questions, please call your gastroenterologist to clarify.  If you requested that your care partner not be given the details of your procedure findings, then the procedure report has been included in a sealed envelope for you to review at your convenience later.  YOU SHOULD EXPECT: Some feelings of bloating in the abdomen. Passage of more gas than usual.  Walking can help get rid of the air that was put into your GI tract during the procedure and reduce the bloating. If you had a lower endoscopy (such as a colonoscopy or flexible sigmoidoscopy) you may notice spotting of blood in your stool or on the toilet paper. If you underwent a bowel prep for your procedure, you may not have a normal bowel movement for a few days.  Please Note:  You might notice some irritation and congestion in your nose or some drainage.  This is from the oxygen used during your procedure.  There is no need for concern and it should clear up in a day or so.  SYMPTOMS TO REPORT IMMEDIATELY:  Following lower  endoscopy (colonoscopy or flexible sigmoidoscopy):  Excessive amounts of blood in the stool  Significant tenderness or worsening of abdominal pains  Swelling of the abdomen that is new, acute  Fever of 100F or higher  For urgent or emergent issues, a gastroenterologist can be reached at any hour by calling [phone]. Do not use MyChart messaging for urgent concerns.    DIET:  We do recommend a   small meal at first, but then you may proceed to your regular diet.  Drink plenty of fluids but you should avoid alcoholic beverages for 24 hours.  ACTIVITY:  You should plan to take it easy for the rest of today and you should NOT DRIVE or use heavy machinery until tomorrow (because of the sedation medicines used during the test).    FOLLOW UP: Our staff will call the number listed on your records the next business day following your procedure.  We will call around 7:15- 8:00 am to check on you and address any questions or concerns that you may have regarding the information given to you following your procedure. If we do not reach you, we will leave a message.     If any biopsies were taken you will be contacted by phone or by letter within the next 1-3 weeks.  Please call us at [phone] if you have not heard about the biopsies in 3 weeks.    SIGNATURES/CONFIDENTIALITY: You and/or your care partner have signed paperwork which will be entered into your electronic medical record.  These signatures attest to the fact that that the information above on your After Visit Summary has been reviewed and is understood.  Full responsibility of the confidentiality of this discharge information lies with you and/or your care-partner. 

## 2022-08-22 ENCOUNTER — Telehealth: Payer: Self-pay

## 2022-08-22 NOTE — Telephone Encounter (Signed)
Left message on answering machine. 

## 2022-08-25 ENCOUNTER — Other Ambulatory Visit: Payer: Self-pay

## 2022-08-25 ENCOUNTER — Encounter: Payer: Self-pay | Admitting: Internal Medicine

## 2022-08-25 ENCOUNTER — Other Ambulatory Visit (HOSPITAL_COMMUNITY): Payer: Self-pay

## 2022-08-25 DIAGNOSIS — I201 Angina pectoris with documented spasm: Secondary | ICD-10-CM

## 2022-08-25 DIAGNOSIS — I10 Essential (primary) hypertension: Secondary | ICD-10-CM

## 2022-08-25 NOTE — Telephone Encounter (Signed)
Refill request for  Nebivolol 20 mg LR 08/24/21, #90, 3 rf (hx provider) LOV  07/01/22 FOV  09/30/22   Please review and advise.  Thanks. Dm/cma

## 2022-08-26 MED ORDER — NEBIVOLOL HCL 20 MG PO TABS: 20.0000 mg | ORAL_TABLET | Freq: Every day | ORAL | 3 refills | Status: DC

## 2022-09-23 ENCOUNTER — Telehealth: Payer: Self-pay | Admitting: Family Medicine

## 2022-09-23 DIAGNOSIS — E7800 Pure hypercholesterolemia, unspecified: Secondary | ICD-10-CM

## 2022-09-23 NOTE — Telephone Encounter (Signed)
Pt stated that [facility] has faxed several times for a refill on pts atorvastatin (LIPITOR) 40 MG tablet [[id number]] and Levothyroxine (which I don't see in his chart). He is concerned that it has been so long without. Pleas contact when order is in. Hopefully today.

## 2022-09-26 MED ORDER — ATORVASTATIN CALCIUM 40 MG PO TABS: 40.0000 mg | ORAL_TABLET | Freq: Every day | ORAL | 3 refills | Status: DC

## 2022-09-26 NOTE — Telephone Encounter (Signed)
Please review and advise on the refill for Levothroxine.   Thanks Dm/cma

## 2022-09-26 NOTE — Telephone Encounter (Signed)
Left detailed VM that Atorvastatin was sent and will discuss other medication at upcoming appointment 5/17.  Dm/cma

## 2022-09-30 ENCOUNTER — Ambulatory Visit: Payer: 59 | Admitting: Family Medicine

## 2022-09-30 ENCOUNTER — Encounter: Payer: Self-pay | Admitting: Family Medicine

## 2022-09-30 VITALS — BP 120/84 | HR 72 | Temp 98.0°F | Ht 69.0 in | Wt 208.8 lb

## 2022-09-30 DIAGNOSIS — K5790 Diverticulosis of intestine, part unspecified, without perforation or abscess without bleeding: Secondary | ICD-10-CM | POA: Insufficient documentation

## 2022-09-30 DIAGNOSIS — I10 Essential (primary) hypertension: Secondary | ICD-10-CM | POA: Diagnosis not present

## 2022-09-30 DIAGNOSIS — J4530 Mild persistent asthma, uncomplicated: Secondary | ICD-10-CM | POA: Diagnosis not present

## 2022-09-30 DIAGNOSIS — I201 Angina pectoris with documented spasm: Secondary | ICD-10-CM

## 2022-09-30 MED ORDER — HYDRALAZINE HCL 25 MG PO TABS: 25.0000 mg | ORAL_TABLET | Freq: Every day | ORAL | 3 refills | Status: DC

## 2022-09-30 NOTE — Assessment & Plan Note (Signed)
We will follow this expectantly for now, as there is not wheezing on exam today. Continue Symbicort and PRN albuterol.

## 2022-09-30 NOTE — Assessment & Plan Note (Signed)
Blood pressure is in good control. Continue hydralazine 25 mg daily and nebivolol 20 mg daily.  

## 2022-09-30 NOTE — Assessment & Plan Note (Signed)
Stable. Continue hydralazine 25 mg daily and nebivolol 20 mg daily.

## 2022-09-30 NOTE — Progress Notes (Signed)
[facility] [address] [place] [address] Dept: [phone] Dept Fax: [phone]  Chronic Care Office Visit  Subjective:    Patient ID: Daniel Zimmerman, male    DOB: June 16, 1960, 62 y.o..   MRN: [id number]  Chief Complaint  Patient presents with   Medical Management of Chronic Issues    3 month f/u.  C/o having sinus congestion x 2 days.  Has taken Advil.    History of Present Illness:  Patient is in today for reassessment of chronic medical issues.  Daniel Zimmerman has a history of essential hypertension and Prinzmetal angina. He is managed on hydralazine 25 mg daily, nebivolol 20 mg daily and takes an L-arginine supplement. He feels like his angina is well managed.   Daniel Zimmerman has a history of mild, persistent asthma. He is managed on Symbicort and PRN albuterol. He also has some associated perennial allergies. He takes Zyrtec daily. He has been using his albuterol every evening more recently. He feels the pollen is flaring his symptoms.   Daniel Zimmerman has a history of episodic rectal bleeding. He had a recent colonoscopy that found a single polyp and noted extensive diverticula.  Past Medical History: Patient Active Problem List   Diagnosis Date Noted   Diverticulosis 09/30/2022   Perennial allergic rhinitis 07/01/2022   Rectal bleeding 06/10/2022   Mild persistent asthma 06/10/2022   Dyspnea 03/29/2022   Prediabetes 08/24/2021   Arthritis 08/10/2021   Left ear injury, initial encounter 06/15/2021   Concussion with no loss of consciousness 06/15/2021   Fatigue 05/02/2018   Radiculopathy of lumbar region 05/02/2018   Irreducible umbilical hernia 05/02/2018   Lower urinary tract symptoms (LUTS) 05/02/2018   Hyperlipidemia 05/01/2017   GERD (gastroesophageal reflux disease) 05/01/2017   Essential hypertension 06/01/2015   Prinzmetal's angina (HCC) 06/01/2015   Past Surgical History:  Procedure Laterality Date    COLONOSCOPY     ROTATOR CUFF REPAIR Right    SPINE SURGERY     Cervical--ruptured disc   UMBILICAL HERNIA REPAIR  2019   WRIST SURGERY Left    Family History  Problem Relation Age of Onset   Cancer Mother        Kidney   Arthritis Mother    Hyperlipidemia Mother    Hypertension Mother    Skin cancer Mother        unknown   Arthritis Father    COPD Father    Heart attack Maternal Uncle    Hypertension Maternal Uncle    Arthritis Maternal Grandmother    Hypertension Maternal Grandmother    Heart attack Maternal Grandmother 28   Arthritis Maternal Grandfather    Arthritis Paternal Grandmother    Heart disease Paternal Grandmother    Arthritis Paternal Grandfather    Prostate cancer Paternal Grandfather 13   Outpatient Medications Prior to Visit  Medication Sig Dispense Refill   albuterol (VENTOLIN HFA) 108 (90 Base) MCG/ACT inhaler INHALE 2 PUFFS BY MOUTH EVERY 6 HOURS AS NEEDED 9 g 5   atorvastatin (LIPITOR) 40 MG tablet Take 1 tablet (40 mg total) by mouth daily. 90 tablet 3   budesonide-formoterol (SYMBICORT) 80-4.5 MCG/ACT inhaler Inhale 2 puffs into the lungs 2 (two) times daily. 1 each 12   cetirizine (ZYRTEC) 10 MG tablet Take 10 mg by mouth daily.     diclofenac (VOLTAREN) 75 MG EC tablet Take 1 tablet (75 mg total) by mouth 2 (two) times daily. 30 tablet 0   famotidine (  PEPCID) 20 MG tablet TAKE 1 TABLET BY MOUTH TWICE A DAY 180 tablet 1   L-Arginine 1000 MG TABS Take by mouth 2 (two) times daily.      Nebivolol HCl (BYSTOLIC) 20 MG TABS Take 1 tablet (20 mg total) by mouth daily. 90 tablet 3   tamsulosin (FLOMAX) 0.4 MG CAPS capsule Take 1 capsule (0.4 mg total) by mouth daily. 30 capsule 3   hydrALAZINE (APRESOLINE) 25 MG tablet Take 1 tablet (25 mg total) by mouth 3 (three) times daily as needed (BP >140/80). 270 tablet 3   No facility-administered medications prior to visit.   Allergies  Allergen Reactions   Gabapentin Other (See Comments)    Severe asthma  attack   Diltiazem Other (See Comments)    Edema   Celebrex [Celecoxib] Swelling   Penicillins     Was a baby when he had reaction--unknown   Objective:   Today's Vitals   09/30/22 0843  BP: 120/84  Pulse: 72  Temp: 98 F (36.7 C)  TempSrc: Temporal  SpO2: 97%  Weight: 208 lb 12.8 oz (94.7 kg)  Height: 5\' 9"  (1.753 m)   Body mass index is 30.83 kg/m.   General: Well developed, well nourished. No acute distress. Lungs: No wheezing or rhonchi. Fine rales in left lower lung. CV: RRR without murmurs or rubs. Pulses 2+ bilaterally. Extremities: No edema noted. Psych: Alert and oriented. Normal mood and affect.  Health Maintenance Due  Topic Date Due   Zoster Vaccines- Shingrix (1 of 2) Never done   Imaging: Echocardiogram 01/13/2020) Left ventricle cavity is normal in size and wall thickness. Normal global wall motion. Normal LV systolic function with visual EF 50-55%. Normal diastolic filling pattern. No significant valvular abnormalities.  IVC not visualized.     Assessment & Plan:   Problem List Items Addressed This Visit       Cardiovascular and Mediastinum   Essential hypertension    Blood pressure is in good control. Continue hydralazine 25 mg daily and nebivolol 20 mg daily.       Relevant Medications   hydrALAZINE (APRESOLINE) 25 MG tablet   Prinzmetal's angina (HCC)    Stable. Continue hydralazine 25 mg daily and nebivolol 20 mg daily.       Relevant Medications   hydrALAZINE (APRESOLINE) 25 MG tablet     Respiratory   Mild persistent asthma - Primary    We will follow this expectantly for now, as there is not wheezing on exam today. Continue Symbicort and PRN albuterol.       Return in about 2 months (around 11/30/2022) for Physical.   Loyola Mast, MD

## 2022-10-05 ENCOUNTER — Telehealth: Payer: Self-pay | Admitting: Internal Medicine

## 2022-10-05 NOTE — Telephone Encounter (Signed)
PT had a colonoscopy on 08/19/22 and it was coded as surveillance. Her insurance has advised that they will not be covering it unless it is coded preventative. She is requesting a call back to discuss.

## 2022-10-07 ENCOUNTER — Telehealth: Payer: Self-pay | Admitting: Internal Medicine

## 2022-10-07 NOTE — Telephone Encounter (Signed)
Left msg on pt's vm to discuss the coding on his colonoscopy

## 2022-10-12 NOTE — Telephone Encounter (Signed)
Inbound called from patient returning phone call. Please advise, thank you.

## 2022-10-12 NOTE — Telephone Encounter (Signed)
1.  Yes, the patient has a history of precancerous polyps which makes this examination SURVEILLANCE. 2.  I am obliged to code properly.  Changing to screening, in this case, could be viewed as fraudulent. 3.  I am sorry, but I have no control of these issues.  Complaints should be directed toward insurance companies, not physicians. Respectively, Dr. Marina Goodell

## 2022-10-12 NOTE — Telephone Encounter (Signed)
This pt is requesting for the coding on his colonoscopy be changed to preventative since that's the only way his insurance will pay for it. I attempted to explain to the pt history of polyps was found and therefore the colonoscopy would be coded as surveillance.

## 2022-10-14 ENCOUNTER — Other Ambulatory Visit (HOSPITAL_COMMUNITY): Payer: Self-pay

## 2022-10-14 ENCOUNTER — Telehealth: Payer: Self-pay

## 2022-10-14 NOTE — Telephone Encounter (Signed)
Pharmacy Patient Advocate Encounter  Prior Authorization for Nebivolol HCl 20MG  tablets has been APPROVED by [employer] from 09/16/2022 to 09/16/2023.   PA # [id number].  Spoke with Pharmacy to process.   Georga Bora [facility] Patient Advocate [phone]

## 2022-10-14 NOTE — Telephone Encounter (Signed)
Called pt back and left a msg on his vm advising him of Dr Lamar Sprinkles response.

## 2022-10-18 ENCOUNTER — Other Ambulatory Visit: Payer: Self-pay | Admitting: Family Medicine

## 2022-10-18 DIAGNOSIS — R399 Unspecified symptoms and signs involving the genitourinary system: Secondary | ICD-10-CM

## 2022-12-05 ENCOUNTER — Encounter: Payer: Self-pay | Admitting: Family Medicine

## 2022-12-05 ENCOUNTER — Ambulatory Visit (INDEPENDENT_AMBULATORY_CARE_PROVIDER_SITE_OTHER): Payer: 59 | Admitting: Family Medicine

## 2022-12-05 VITALS — BP 118/72 | HR 60 | Temp 98.2°F | Ht 69.0 in | Wt 212.6 lb

## 2022-12-05 DIAGNOSIS — J4530 Mild persistent asthma, uncomplicated: Secondary | ICD-10-CM | POA: Diagnosis not present

## 2022-12-05 DIAGNOSIS — Z0000 Encounter for general adult medical examination without abnormal findings: Secondary | ICD-10-CM | POA: Diagnosis not present

## 2022-12-05 DIAGNOSIS — R7303 Prediabetes: Secondary | ICD-10-CM | POA: Diagnosis not present

## 2022-12-05 DIAGNOSIS — I10 Essential (primary) hypertension: Secondary | ICD-10-CM | POA: Diagnosis not present

## 2022-12-05 DIAGNOSIS — M19042 Primary osteoarthritis, left hand: Secondary | ICD-10-CM

## 2022-12-05 DIAGNOSIS — E782 Mixed hyperlipidemia: Secondary | ICD-10-CM

## 2022-12-05 DIAGNOSIS — M19041 Primary osteoarthritis, right hand: Secondary | ICD-10-CM

## 2022-12-05 LAB — LIPID PANEL
Cholesterol: 117 mg/dL (ref 0–200)
HDL: 43 mg/dL (ref >39.00)
LDL Cholesterol: 63 mg/dL (ref 0–99)
NonHDL: 74.23
Total CHOL/HDL Ratio: 3
Triglycerides: 54 mg/dL (ref 0.0–149.0)
VLDL: 10.8 mg/dL (ref 0.0–40.0)

## 2022-12-05 LAB — BASIC METABOLIC PANEL
BUN: 11 mg/dL (ref 6–23)
CO2: 25 mEq/L (ref 19–32)
Calcium: 8.7 mg/dL (ref 8.4–10.5)
Chloride: 105 mEq/L (ref 96–112)
Creatinine, Ser: 0.91 mg/dL (ref 0.40–1.50)
GFR: 91.37 mL/min (ref >60.00)
Glucose, Bld: 111 mg/dL — ABNORMAL HIGH (ref 70–99)
Potassium: 4.6 mEq/L (ref 3.5–5.1)
Sodium: 138 mEq/L (ref 135–145)

## 2022-12-05 LAB — HEMOGLOBIN A1C: Hgb A1c MFr Bld: 6.1 % (ref 4.6–6.5)

## 2022-12-05 NOTE — Assessment & Plan Note (Signed)
We will follow this expectantly for now, as there is not wheezing on exam today. Continue Symbicort and PRN albuterol.

## 2022-12-05 NOTE — Assessment & Plan Note (Signed)
I will check his annual A1c today.

## 2022-12-05 NOTE — Progress Notes (Signed)
[facility] [place] [facility] [address] [place] [address] Dept: [phone] Dept Fax: [phone]  Annual Physical Visit  Subjective:    Patient ID: Daniel Zimmerman, male    DOB: 1960/08/07, 62 y.o..   MRN: [id number]  Chief Complaint  Patient presents with   Annual Exam    CPE/labs.  Fasting today.  No concerns.      History of Present Illness:  Patient is in today for an annual physical/preventative visit.  Mr. Rivet has a history of essential hypertension and Prinzmetal angina. He is managed on hydralazine 25 mg daily, nebivolol 20 mg daily and takes an L-arginine supplement. He feels like his angina is well managed.   Mr. Shill has a history of mild, persistent asthma. He is managed on Symbicort and PRN albuterol. He continues to feel the need to use his albuterol daily, esp. when the outdoor temps are 90 +.  Review of Systems  Constitutional:  Negative for chills, diaphoresis, fever, malaise/fatigue and weight loss.  HENT:  Negative for congestion, ear pain, hearing loss, sinus pain, sore throat and tinnitus.   Eyes:  Negative for blurred vision, pain, discharge and redness.  Respiratory:  Positive for wheezing. Negative for cough and shortness of breath.        History of asthma, as noted above.  Cardiovascular:  Negative for chest pain and palpitations.  Gastrointestinal:  Positive for heartburn. Negative for abdominal pain, constipation, diarrhea, nausea and vomiting.       Avoids tomatoes, which has helped to control symptoms.  Musculoskeletal:  Positive for joint pain. Negative for back pain.       Has some arthritis in finger joints bilaterally.  Skin:  Negative for itching and rash.  Psychiatric/Behavioral:  Negative for depression. The patient is not nervous/anxious.    Past Medical History: Patient Active Problem List   Diagnosis Date Noted   Diverticulosis 09/30/2022   Perennial allergic rhinitis 07/01/2022    Rectal bleeding 06/10/2022   Mild persistent asthma 06/10/2022   Dyspnea 03/29/2022   Prediabetes 08/24/2021   Arthritis 08/10/2021   Radiculopathy of lumbar region 05/02/2018   Irreducible umbilical hernia 05/02/2018   Lower urinary tract symptoms (LUTS) 05/02/2018   Hyperlipidemia 05/01/2017   GERD (gastroesophageal reflux disease) 05/01/2017   Essential hypertension 06/01/2015   Prinzmetal's angina (HCC) 06/01/2015   Past Surgical History:  Procedure Laterality Date   COLONOSCOPY     ROTATOR CUFF REPAIR Right    SPINE SURGERY     Cervical--ruptured disc   UMBILICAL HERNIA REPAIR  2019   WRIST SURGERY Left    Family History  Problem Relation Age of Onset   Cancer Mother        Kidney   Arthritis Mother    Hyperlipidemia Mother    Hypertension Mother    Skin cancer Mother        unknown   Arthritis Father    COPD Father    Heart attack Maternal Uncle    Hypertension Maternal Uncle    Arthritis Maternal Grandmother    Hypertension Maternal Grandmother    Heart attack Maternal Grandmother 57   Arthritis Maternal Grandfather    Arthritis Paternal Grandmother    Heart disease Paternal Grandmother    Arthritis Paternal Grandfather    Prostate cancer Paternal Grandfather 32   Outpatient Medications Prior to Visit  Medication Sig Dispense Refill   albuterol (VENTOLIN HFA) 108 (90 Base) MCG/ACT inhaler INHALE 2 PUFFS BY MOUTH EVERY 6  HOURS AS NEEDED 9 g 5   atorvastatin (LIPITOR) 40 MG tablet Take 1 tablet (40 mg total) by mouth daily. 90 tablet 3   budesonide-formoterol (SYMBICORT) 80-4.5 MCG/ACT inhaler Inhale 2 puffs into the lungs 2 (two) times daily. 1 each 12   cetirizine (ZYRTEC) 10 MG tablet Take 10 mg by mouth daily.     diclofenac (VOLTAREN) 75 MG EC tablet Take 1 tablet (75 mg total) by mouth 2 (two) times daily. 30 tablet 0   famotidine (PEPCID) 20 MG tablet TAKE 1 TABLET BY MOUTH TWICE A DAY 180 tablet 1   hydrALAZINE (APRESOLINE) 25 MG tablet Take 1 tablet  (25 mg total) by mouth daily at 10 pm. 90 tablet 3   L-Arginine 1000 MG TABS Take by mouth 2 (two) times daily.      Nebivolol HCl (BYSTOLIC) 20 MG TABS Take 1 tablet (20 mg total) by mouth daily. 90 tablet 3   tamsulosin (FLOMAX) 0.4 MG CAPS capsule Take 1 capsule by mouth once daily 90 capsule 3   No facility-administered medications prior to visit.   Allergies  Allergen Reactions   Gabapentin Other (See Comments)    Severe asthma attack   Diltiazem Other (See Comments)    Edema   Celebrex [Celecoxib] Swelling   Penicillins     Was a baby when he had reaction--unknown   Objective:   Today's Vitals   12/05/22 0911  BP: 118/72  Pulse: 60  Temp: 98.2 F (36.8 C)  TempSrc: Temporal  SpO2: 97%  Weight: 212 lb 9.6 oz (96.4 kg)  Height: 5\' 9"  (1.753 m)   Body mass index is 31.4 kg/m.   General: Well developed, well nourished. No acute distress. HEENT: Normocephalic, non-traumatic. PERRL, EOMI. Conjunctiva clear. External ears normal.   EAC and TMs normal bilaterally. Nose clear without congestion or rhinorrhea. Mucous membranes   moist. Oropharynx clear. Good dentition. Neck: Supple. No lymphadenopathy. No thyromegaly. Lungs: Clear to auscultation bilaterally. No wheezing, rales or rhonchi. CV: RRR without murmurs or rubs. Pulses 2+ bilaterally. Abdomen: Soft, non-tender. Bowel sounds positive, normal pitch and frequency. No   hepatosplenomegaly. No rebound or guarding. Mild diastasis recti. Extremities: Full ROM. There is mild nodular changes and deformity of multiple DIP and PIP joints.   No edema noted. Skin: Warm and dry. No rashes. Psych: Alert and oriented. Normal mood and affect.  There are no preventive care reminders to display for this patient.    Assessment & Plan:   Problem List Items Addressed This Visit       Cardiovascular and Mediastinum   Essential hypertension    Blood pressure is in good control. Continue hydralazine 25 mg daily and nebivolol 20  mg daily. I will check renal labs today.       Relevant Orders   Basic metabolic panel     Respiratory   Mild persistent asthma    We will follow this expectantly for now, as there is not wheezing on exam today. Continue Symbicort and PRN albuterol.        Musculoskeletal and Integument   Osteoarthritis of fingers of both hands    Managing for now. Continue expectant management.        Other   Hyperlipidemia    I will check annual lipids today.  Continue atorvastatin 40 mg daily.      Relevant Orders   Lipid panel   Prediabetes    I will check his annual A1c today.  Relevant Orders   Basic metabolic panel   Hemoglobin A1c   Other Visit Diagnoses     Annual physical exam    -  Primary   Overall health is good. Reviewed recommended screenigns. UTD on immunizations.       Return in about 6 months (around 06/07/2023) for Reassessment.   Loyola Mast, MD

## 2022-12-05 NOTE — Assessment & Plan Note (Signed)
Managing for now. Continue expectant management.

## 2022-12-05 NOTE — Assessment & Plan Note (Signed)
I will check annual lipids today.  Continue atorvastatin 40 mg daily.

## 2022-12-05 NOTE — Assessment & Plan Note (Addendum)
Blood pressure is in good control. Continue hydralazine 25 mg daily and nebivolol 20 mg daily. I will check renal labs today.

## 2023-01-30 ENCOUNTER — Ambulatory Visit: Payer: 59 | Admitting: Family Medicine

## 2023-01-30 VITALS — BP 118/84 | HR 53 | Temp 97.9°F | Ht 69.0 in | Wt 216.0 lb

## 2023-01-30 DIAGNOSIS — J0100 Acute maxillary sinusitis, unspecified: Secondary | ICD-10-CM

## 2023-01-30 DIAGNOSIS — R197 Diarrhea, unspecified: Secondary | ICD-10-CM

## 2023-01-30 LAB — POC COVID19 BINAXNOW: SARS Coronavirus 2 Ag: NEGATIVE

## 2023-01-30 MED ORDER — DOXYCYCLINE HYCLATE 100 MG PO TABS: 100.0000 mg | ORAL_TABLET | Freq: Two times a day (BID) | ORAL | 0 refills | Status: DC

## 2023-01-30 NOTE — Progress Notes (Signed)
[facility] [address] [place] [address] Dept: [phone] Dept Fax: [phone]  Office Visit  Subjective:    Patient ID: Daniel Zimmerman, male    DOB: 1960-12-19, 62 y.o..   MRN: [id number]  Chief Complaint  Patient presents with   Diarrhea    Diarrhea for the past week   Nausea   Nasal Congestion    Nasal drainage, sore throat,  x1week.    History of Present Illness:  Patient is in today with a 1-week history of sinus pressure/pain and sore throat. He has also had a 3-day history of diarrhea. For the past 2-3 weeks, he has had a flare of his typical allergy symptoms, with rhinorrhea. He denies any fever or N/V.   Past Medical History: Patient Active Problem List   Diagnosis Date Noted   Diverticulosis 09/30/2022   Perennial allergic rhinitis 07/01/2022   Rectal bleeding 06/10/2022   Mild persistent asthma 06/10/2022   Dyspnea 03/29/2022   Prediabetes 08/24/2021   Osteoarthritis of fingers of both hands 08/10/2021   Radiculopathy of lumbar region 05/02/2018   Irreducible umbilical hernia 05/02/2018   Lower urinary tract symptoms (LUTS) 05/02/2018   Hyperlipidemia 05/01/2017   GERD (gastroesophageal reflux disease) 05/01/2017   Essential hypertension 06/01/2015   Prinzmetal's angina (HCC) 06/01/2015   Past Surgical History:  Procedure Laterality Date   COLONOSCOPY     ROTATOR CUFF REPAIR Right    SPINE SURGERY     Cervical--ruptured disc   UMBILICAL HERNIA REPAIR  2019   WRIST SURGERY Left    Family History  Problem Relation Age of Onset   Cancer Mother        Kidney   Arthritis Mother    Hyperlipidemia Mother    Hypertension Mother    Skin cancer Mother        unknown   Arthritis Father    COPD Father    Heart attack Maternal Uncle    Hypertension Maternal Uncle    Arthritis Maternal Grandmother    Hypertension Maternal Grandmother    Heart attack Maternal Grandmother 64   Arthritis  Maternal Grandfather    Arthritis Paternal Grandmother    Heart disease Paternal Grandmother    Arthritis Paternal Grandfather    Prostate cancer Paternal Grandfather 2   Outpatient Medications Prior to Visit  Medication Sig Dispense Refill   albuterol (VENTOLIN HFA) 108 (90 Base) MCG/ACT inhaler INHALE 2 PUFFS BY MOUTH EVERY 6 HOURS AS NEEDED 9 g 5   atorvastatin (LIPITOR) 40 MG tablet Take 1 tablet (40 mg total) by mouth daily. 90 tablet 3   budesonide-formoterol (SYMBICORT) 80-4.5 MCG/ACT inhaler Inhale 2 puffs into the lungs 2 (two) times daily. 1 each 12   cetirizine (ZYRTEC) 10 MG tablet Take 10 mg by mouth daily.     diclofenac (VOLTAREN) 75 MG EC tablet Take 1 tablet (75 mg total) by mouth 2 (two) times daily. 30 tablet 0   famotidine (PEPCID) 20 MG tablet TAKE 1 TABLET BY MOUTH TWICE A DAY 180 tablet 1   hydrALAZINE (APRESOLINE) 25 MG tablet Take 1 tablet (25 mg total) by mouth daily at 10 pm. 90 tablet 3   L-Arginine 1000 MG TABS Take by mouth 2 (two) times daily.      Nebivolol HCl (BYSTOLIC) 20 MG TABS Take 1 tablet (20 mg total) by mouth daily. 90 tablet 3   tamsulosin (FLOMAX) 0.4 MG CAPS capsule Take 1 capsule by mouth once daily 90  capsule 3   No facility-administered medications prior to visit.   Allergies  Allergen Reactions   Gabapentin Other (See Comments)    Severe asthma attack   Diltiazem Other (See Comments)    Edema   Celebrex [Celecoxib] Swelling   Penicillins     Was a baby when he had reaction--unknown     Objective:   Today's Vitals   01/30/23 1050  BP: 118/84  Pulse: (!) 53  Temp: 97.9 F (36.6 C)  TempSrc: Temporal  SpO2: 98%  Weight: 216 lb (98 kg)  Height: 5\' 9"  (1.753 m)   Body mass index is 31.9 kg/m.   General: Well developed, well nourished. No acute distress. HEENT: Normocephalic, non-traumatic. PERRL, EOMI. Conjunctiva clear. External ears normal. EAC and TMs normal   bilaterally. Nose with mild congestion and rhinorrhea. Pain  on percussion over the right maxillary and frontal sinus. Mucous   membranes moist. Oropharynx clear. Good dentition. Neck: Supple. No lymphadenopathy. No thyromegaly. Lungs: Clear to auscultation bilaterally. No wheezing, rales or rhonchi. CV: RRR without murmurs or rubs. Pulses 2+ bilaterally. Abdomen: Soft, non-tender. Bowel sounds positive, normal pitch and frequency. No hepatosplenomegaly. No rebound or   guarding. Psych: Alert and oriented. Normal mood and affect.  There are no preventive care reminders to display for this patient.  Lab Results POCT Covid: Neg.    Assessment & Plan:   Problem List Items Addressed This Visit   None Visit Diagnoses     Acute non-recurrent maxillary sinusitis    -  Primary   I will prescribe a course of doxycycline. He can continue nasal saline irrigaiton.   Relevant Medications   doxycycline (VIBRA-TABS) 100 MG tablet   Other Relevant Orders   POC COVID-19 (Completed)   Diarrhea, unspecified type       Recommend Imodium and hydration.       Return if symptoms worsen or fail to improve.   Loyola Mast, MD

## 2023-06-09 ENCOUNTER — Ambulatory Visit: Payer: 59 | Admitting: Family Medicine

## 2023-06-09 ENCOUNTER — Encounter: Payer: Self-pay | Admitting: Family Medicine

## 2023-06-09 VITALS — BP 116/78 | HR 56 | Temp 97.7°F | Ht 69.0 in | Wt 219.8 lb

## 2023-06-09 DIAGNOSIS — J4530 Mild persistent asthma, uncomplicated: Secondary | ICD-10-CM

## 2023-06-09 DIAGNOSIS — I10 Essential (primary) hypertension: Secondary | ICD-10-CM | POA: Diagnosis not present

## 2023-06-09 DIAGNOSIS — L821 Other seborrheic keratosis: Secondary | ICD-10-CM

## 2023-06-09 DIAGNOSIS — M279 Disease of jaws, unspecified: Secondary | ICD-10-CM

## 2023-06-09 MED ORDER — ALBUTEROL SULFATE HFA 108 (90 BASE) MCG/ACT IN AERS
2.0000 | INHALATION_SPRAY | Freq: Four times a day (QID) | RESPIRATORY_TRACT | 5 refills | Status: DC | PRN
Start: 2023-06-09 — End: 2023-09-11

## 2023-06-09 MED ORDER — BUDESONIDE-FORMOTEROL FUMARATE 160-4.5 MCG/ACT IN AERO: 2.0000 | INHALATION_SPRAY | Freq: Two times a day (BID) | RESPIRATORY_TRACT | 3 refills | Status: DC

## 2023-06-09 NOTE — Assessment & Plan Note (Signed)
In light of near daily need for albuterol, I will try stepping Daniel Zimmerman up to the Symbicort 160-4.5 mcg/ACT inhaler to use 2 puffs bid. He can continue PRN albuterol.

## 2023-06-09 NOTE — Progress Notes (Signed)
[facility] [address] [place] [address] Dept: [phone] Dept Fax: [phone]  Chronic Care Office Visit  Subjective:    Patient ID: Daniel Zimmerman, male    DOB: November 19, 1960, 63 y.o..   MRN: [id number]  Chief Complaint  Patient presents with   Follow-up    6 month f/u.  No concerns.  Fasting today.  Want dermatology referral   History of Present Illness:  Patient is in today for reassessment of chronic medical issues.  Mr. Summerlin has a history of essential hypertension and Prinzmetal angina. He is managed on hydralazine 25 mg daily, nebivolol 20 mg daily and takes an L-arginine supplement. He feels like his angina is well managed.   Mr. Lantzy has a history of mild, persistent asthma. He is managed on Symbicort 80-4.5 mcg/ACT 2 puffs bid and PRN albuterol. He continues to feel the need to use his albuterol virtually daily.  Mr. Caponi notes several skin lesions he had concern about.  Mr. Ainsley notes that about a year ago, he had a molar pulled from his upper right maxilla. He states he continued to shed some tooth fragments over time. However, he feels he is developing a prominence to the upper jaw in that area. This is not necessarily tender to him.  Past Medical History: Patient Active Problem List   Diagnosis Date Noted   Diverticulosis 09/30/2022   Perennial allergic rhinitis 07/01/2022   Rectal bleeding 06/10/2022   Mild persistent asthma 06/10/2022   Dyspnea 03/29/2022   Prediabetes 08/24/2021   Osteoarthritis of fingers of both hands 08/10/2021   Radiculopathy of lumbar region 05/02/2018   Irreducible umbilical hernia 05/02/2018   Lower urinary tract symptoms (LUTS) 05/02/2018   Hyperlipidemia 05/01/2017   GERD (gastroesophageal reflux disease) 05/01/2017   Essential hypertension 06/01/2015   Prinzmetal's angina (HCC) 06/01/2015   Past Surgical History:  Procedure Laterality Date   COLONOSCOPY      ROTATOR CUFF REPAIR Right    SPINE SURGERY     Cervical--ruptured disc   UMBILICAL HERNIA REPAIR  2019   WRIST SURGERY Left    Family History  Problem Relation Age of Onset   Cancer Mother        Kidney   Arthritis Mother    Hyperlipidemia Mother    Hypertension Mother    Skin cancer Mother        unknown   Arthritis Father    COPD Father    Heart attack Maternal Uncle    Hypertension Maternal Uncle    Arthritis Maternal Grandmother    Hypertension Maternal Grandmother    Heart attack Maternal Grandmother 61   Arthritis Maternal Grandfather    Arthritis Paternal Grandmother    Heart disease Paternal Grandmother    Arthritis Paternal Grandfather    Prostate cancer Paternal Grandfather 42   Outpatient Medications Prior to Visit  Medication Sig Dispense Refill   atorvastatin (LIPITOR) 40 MG tablet Take 1 tablet (40 mg total) by mouth daily. 90 tablet 3   cetirizine (ZYRTEC) 10 MG tablet Take 10 mg by mouth daily.     diclofenac (VOLTAREN) 75 MG EC tablet Take 1 tablet (75 mg total) by mouth 2 (two) times daily. 30 tablet 0   famotidine (PEPCID) 20 MG tablet TAKE 1 TABLET BY MOUTH TWICE A DAY 180 tablet 1   hydrALAZINE (APRESOLINE) 25 MG tablet Take 1 tablet (25 mg total) by mouth daily at 10 pm. 90 tablet 3   L-Arginine  1000 MG TABS Take by mouth 2 (two) times daily.      Nebivolol HCl (BYSTOLIC) 20 MG TABS Take 1 tablet (20 mg total) by mouth daily. 90 tablet 3   tamsulosin (FLOMAX) 0.4 MG CAPS capsule Take 1 capsule by mouth once daily 90 capsule 3   albuterol (VENTOLIN HFA) 108 (90 Base) MCG/ACT inhaler INHALE 2 PUFFS BY MOUTH EVERY 6 HOURS AS NEEDED 9 g 5   budesonide-formoterol (SYMBICORT) 80-4.5 MCG/ACT inhaler Inhale 2 puffs into the lungs 2 (two) times daily. 1 each 12   doxycycline (VIBRA-TABS) 100 MG tablet Take 1 tablet (100 mg total) by mouth 2 (two) times daily. 20 tablet 0   No facility-administered medications prior to visit.   Allergies  Allergen Reactions    Gabapentin Other (See Comments)    Severe asthma attack   Diltiazem Other (See Comments)    Edema   Celebrex [Celecoxib] Swelling   Penicillins     Was a baby when he had reaction--unknown   Objective:   Today's Vitals   06/09/23 0900  BP: 116/78  Pulse: (!) 56  Temp: 97.7 F (36.5 C)  TempSrc: Temporal  SpO2: 97%  Weight: 219 lb 12.8 oz (99.7 kg)  Height: 5\' 9"  (1.753 m)   Body mass index is 32.46 kg/m.   General: Well developed, well nourished. No acute distress. HEENT: Normocephalic, non-traumatic. Mucous membranes moist. The gum ridge is well helaed and   there are no visible protruding tooth fragments. There is a bony prominence to the right maxilla. This is  not tender or boggy. Oropharynx clear. Good dentition. Lungs: Clear to auscultation bilaterally without wheezes, rales or rhonchi.  Skin: Warm and dry. There is a 1 cm light-brown plaque with a warty surface at the right temple. There   is a medium brown, warty raised lesion on the left chest near the nipple. Psych: Alert and oriented. Normal mood and affect.  Health Maintenance Due  Topic Date Due   Pneumococcal Vaccine 27-44 Years old (1 of 2 - PCV) Never done     Assessment & Plan:   Problem List Items Addressed This Visit       Cardiovascular and Mediastinum   Essential hypertension - Primary   Blood pressure is in good control. Continue hydralazine 25 mg daily and nebivolol 20 mg daily.         Respiratory   Mild persistent asthma   In light of near daily need for albuterol, I will try stepping Mr. Christley up to the Symbicort 160-4.5 mcg/ACT inhaler to use 2 puffs bid. He can continue PRN albuterol.      Relevant Medications   albuterol (VENTOLIN HFA) 108 (90 Base) MCG/ACT inhaler   budesonide-formoterol (SYMBICORT) 160-4.5 MCG/ACT inhaler   Other Visit Diagnoses       Seborrheic keratoses       Reassured Mr. Driggers these are benign. He does not want to consider cryotherapy at this point.      Disorder of maxilla       Bony prominence to right maxilla. I recommend he follow up with his dentist for x-rays and evaluation.       Return in about 6 months (around 12/07/2023) for Annual preventative care.   Loyola Mast, MD

## 2023-06-09 NOTE — Assessment & Plan Note (Signed)
Blood pressure is in good control. Continue hydralazine 25 mg daily and nebivolol 20 mg daily.

## 2023-07-07 ENCOUNTER — Ambulatory Visit: Payer: Self-pay | Admitting: Family Medicine

## 2023-07-07 ENCOUNTER — Telehealth: Payer: Self-pay | Admitting: Family Medicine

## 2023-07-07 NOTE — Telephone Encounter (Signed)
FYI: This call has been transferred to triage nurse: the Triage Nurse. Once the result note has been entered staff can address the message at that time.  Patient called in with the following symptoms:  Red Word:chest pain and swollen legs night and day, especially the Left leg.   Pt scheduled OV with Dr Veto Kemps for 07/12/23, it was made on 07/04/23. I called nurse triage and transferred him over.    Please advise at [facility]   [phone]    Message is routed to Provider Pool.

## 2023-07-07 NOTE — Telephone Encounter (Signed)
Chief Complaint: Chest pain Symptoms: Chest pain, leg swelling, arm pain Frequency: Comes and goes Pertinent Negatives: Patient denies Current chest pain during triage, SOB, diaphoresis, jaw pain. Disposition: [] ED /[x] Urgent Care (no appt availability in office) / [] Appointment(In office/virtual)/ []  [facility]/ [] Home Care/ [x] Refused Recommended Disposition /[] [facility]/ []  Follow-up with PCP Additional Notes: Patient called with complaints of chest pain that has worsened in the past two weeks. Patient states that he has been diagnosed with prinzmetal angina since about 3 years ago and takes medication for it that , until recently, has kept it in control. Chest pain is sharp and "on the lower left side of my heart," that lasts no longer than a minute, not currently present during this triage, and often accompanied by left arm pain. Patient denies SOB, palpations, diaphoresis, jaw pain, and states that he usually has problems with indigestion unrelated to the chest pain. Patient states that he has a watch that takes EKGs and states, "it's showing that it's doing some funky stuff," when the chest pain occurs. Patient also states that his leg swelling is worsening with the chest pain, particularly the left with nonpitting edema. Patient states that chest pain is severe when it occurs. Patient advised by this RN to go to UC within 24 hours due to no availability in the office, to which patient refused, stating, "No, I don't think it's that serious enough for me to go to UC. I've had this for a long time and I might need my medications adjusted. I just need an appointment with my doctor. I can come Wednesday (2/26)." Patient advised by this RN that note will be routed to office for PCP review with request for message/update for callback regarding. Patient advised by this RN to call 911 if chest pain lasts >53min and SOB, diaphoresis--worsening symptoms occur. Patient verbalized  understanding   Reason for Disposition  [1] Chest pain lasts < 5 minutes AND [2] NO chest pain or cardiac symptoms (e.g., breathing difficulty, sweating) now  (Exception: Chest pains that last only a few seconds.)  Answer Assessment - Initial Assessment Questions 1. LOCATION: "Where does it hurt?"       Left side 2. RADIATION: "Does the pain go anywhere else?" (e.g., into neck, jaw, arms, back)     Left arm 3. ONSET: "When did the chest pain begin?" (Minutes, hours or days)      "Years, last couple weeks worsened." 4. PATTERN: "Does the pain come and go, or has it been constant since it started?"  "Does it get worse with exertion?"      Comes and goes "it's not happening right now. 5. DURATION: "How long does it last" (e.g., seconds, minutes, hours)     "One minute, no longer than that" 6. SEVERITY: "How bad is the pain?"  (e.g., Scale 1-10; mild, moderate, or severe)    - MILD (1-3): doesn't interfere with normal activities     - MODERATE (4-7): interferes with normal activities or awakens from sleep    - SEVERE (8-10): excruciating pain, unable to do any normal activities       Severe 7. CARDIAC RISK FACTORS: "Do you have any history of heart problems or risk factors for heart disease?" (e.g., angina, prior heart attack; diabetes, high blood pressure, high cholesterol, smoker, or strong family history of heart disease)     "I have prinzmetal angina and it's been under control for the most part but lately in the past two weeks  it's gotten worse." 8. PULMONARY RISK FACTORS: "Do you have any history of lung disease?"  (e.g., blood clots in lung, asthma, emphysema, birth control pills)     Asthma - inhalers daily. 9. CAUSE: "What do you think is causing the chest pain?"     "I was diagnosed with Prinzmetal Angina." 10. OTHER SYMPTOMS: "Do you have any other symptoms?" (e.g., dizziness, nausea, vomiting, sweating, fever, difficulty breathing, cough)       Left leg swelling, left arm  pain. "I've done EKG with my watch when it happens and you can see it's been doing some pretty funky stuff."  Protocols used: Chest Pain-A-AH

## 2023-07-07 NOTE — Telephone Encounter (Signed)
Noted. Will await NT note.

## 2023-07-10 NOTE — Telephone Encounter (Signed)
 I called and spoke with patient and asked how he was doing and he said that he is fine. Patient said that he believe it is his Prinzmetal acting up again. He has an appointment with Dr. Veto Kemps on Wednesday and had to set up a new patient appointment with cardiologist since it has been 4 years and that appointment is set for the end of April per patient. Daniel Zimmerman said that he will seek care at his nearest ED if he feels that he can not control the pain.

## 2023-07-10 NOTE — Telephone Encounter (Signed)
 Noted. Dm/cma

## 2023-07-12 ENCOUNTER — Encounter: Payer: Self-pay | Admitting: Family Medicine

## 2023-07-12 ENCOUNTER — Ambulatory Visit: Payer: 59 | Admitting: Family Medicine

## 2023-07-12 VITALS — BP 118/74 | HR 76 | Temp 98.4°F | Ht 69.0 in | Wt 223.8 lb

## 2023-07-12 DIAGNOSIS — I201 Angina pectoris with documented spasm: Secondary | ICD-10-CM

## 2023-07-12 DIAGNOSIS — R0600 Dyspnea, unspecified: Secondary | ICD-10-CM

## 2023-07-12 DIAGNOSIS — R079 Chest pain, unspecified: Secondary | ICD-10-CM | POA: Diagnosis not present

## 2023-07-12 LAB — COMPREHENSIVE METABOLIC PANEL
ALT: 62 U/L — ABNORMAL HIGH (ref 0–53)
AST: 28 U/L (ref 0–37)
Albumin: 4.2 g/dL (ref 3.5–5.2)
Alkaline Phosphatase: 77 U/L (ref 39–117)
BUN: 13 mg/dL (ref 6–23)
CO2: 27 meq/L (ref 19–32)
Calcium: 8.8 mg/dL (ref 8.4–10.5)
Chloride: 104 meq/L (ref 96–112)
Creatinine, Ser: 0.94 mg/dL (ref 0.40–1.50)
GFR: 87.51 mL/min (ref >60.00)
Glucose, Bld: 103 mg/dL — ABNORMAL HIGH (ref 70–99)
Potassium: 4.4 meq/L (ref 3.5–5.1)
Sodium: 139 meq/L (ref 135–145)
Total Bilirubin: 0.7 mg/dL (ref 0.2–1.2)
Total Protein: 6.7 g/dL (ref 6.0–8.3)

## 2023-07-12 LAB — CBC
HCT: 46.8 % (ref 39.0–52.0)
Hemoglobin: 15.9 g/dL (ref 13.0–17.0)
MCHC: 33.9 g/dL (ref 30.0–36.0)
MCV: 89.9 fL (ref 78.0–100.0)
Platelets: 199 10*3/uL (ref 150.0–400.0)
RBC: 5.21 Mil/uL (ref 4.22–5.81)
RDW: 13.1 % (ref 11.5–15.5)
WBC: 6.1 10*3/uL (ref 4.0–10.5)

## 2023-07-12 LAB — BRAIN NATRIURETIC PEPTIDE: Pro B Natriuretic peptide (BNP): 22 pg/mL (ref 0.0–100.0)

## 2023-07-12 LAB — MAGNESIUM: Magnesium: 1.9 mg/dL (ref 1.5–2.5)

## 2023-07-12 LAB — TSH: TSH: 1.31 u[IU]/mL (ref 0.35–5.50)

## 2023-07-12 MED ORDER — ISOSORBIDE MONONITRATE ER 60 MG PO TB24: 60.0000 mg | ORAL_TABLET | Freq: Every day | ORAL | 2 refills | Status: DC

## 2023-07-12 NOTE — Assessment & Plan Note (Signed)
 I do not see clear signs of heart failure. I will check some labs to assess for HF or other causes of his breathlessness.

## 2023-07-12 NOTE — Assessment & Plan Note (Signed)
 Daniel Zimmerman is having an increased incidence of his vasospastic angina. I will check labs to assess for potential triggers. I also will check a troponin to make sure there is no sign of significant coronary ischemia. I recommend we try a long-acting nitrate to see if this will reduce his episodes. I will have him back in 2 weeks to reassess. He knows if he has an episode of angina lasting > 30 min, he should seek emergency evaluation.

## 2023-07-12 NOTE — Progress Notes (Signed)
 [facility] [address] [place] [address] Dept: [phone] Dept Fax: [phone]  Office Visit  Subjective:    Patient ID: Daniel Zimmerman, male    DOB: 01/13/1961, 63 y.o..   MRN: [id number]  Chief Complaint  Patient presents with   Heart Problem    C/o having issues with heart issues that have gotten worse in the last week.     History of Present Illness:  Patient is in today noting an increase in episodes of his angina over the past week. Mr. Yeley has a well-established history of vasospastic angina. He notes that he has had daily episodes for years, but int he last week has been having multiple episodes a day. Also, he notes radiation of pain into his left arm. He has noted increased lower leg edema developing during the day, though this does go down over night. He has had increased dyspnea, particularly with climbing stairs. he has also experienced a gurgling in his breathing while sleeping. He had been previously treated with diltiazem, but had issues with worsening lower leg edema.   Mr. Ruderman notes he tried to get in with his cardiologist. However, it had been > 3 years since he was seen, so he is being treated by their practice as a new patient. He is not scheduled to be seen until late April.  Past Medical History: Patient Active Problem List   Diagnosis Date Noted   Diverticulosis 09/30/2022   Perennial allergic rhinitis 07/01/2022   Rectal bleeding 06/10/2022   Mild persistent asthma 06/10/2022   Dyspnea 03/29/2022   Prediabetes 08/24/2021   Osteoarthritis of fingers of both hands 08/10/2021   Radiculopathy of lumbar region 05/02/2018   Irreducible umbilical hernia 05/02/2018   Lower urinary tract symptoms (LUTS) 05/02/2018   Hyperlipidemia 05/01/2017   GERD (gastroesophageal reflux disease) 05/01/2017   Essential hypertension 06/01/2015   Prinzmetal's angina (HCC) 06/01/2015   Past Surgical History:   Procedure Laterality Date   COLONOSCOPY     ROTATOR CUFF REPAIR Right    SPINE SURGERY     Cervical--ruptured disc   UMBILICAL HERNIA REPAIR  2019   WRIST SURGERY Left    Family History  Problem Relation Age of Onset   Cancer Mother        Kidney   Arthritis Mother    Hyperlipidemia Mother    Hypertension Mother    Skin cancer Mother        unknown   Arthritis Father    COPD Father    Heart attack Maternal Uncle    Hypertension Maternal Uncle    Arthritis Maternal Grandmother    Hypertension Maternal Grandmother    Heart attack Maternal Grandmother 84   Arthritis Maternal Grandfather    Arthritis Paternal Grandmother    Heart disease Paternal Grandmother    Arthritis Paternal Grandfather    Prostate cancer Paternal Grandfather 45   Outpatient Medications Prior to Visit  Medication Sig Dispense Refill   albuterol (VENTOLIN HFA) 108 (90 Base) MCG/ACT inhaler Inhale 2 puffs into the lungs every 6 (six) hours as needed. 9 g 5   atorvastatin (LIPITOR) 40 MG tablet Take 1 tablet (40 mg total) by mouth daily. 90 tablet 3   budesonide-formoterol (SYMBICORT) 160-4.5 MCG/ACT inhaler Inhale 2 puffs into the lungs 2 (two) times daily. 1 each 3   cetirizine (ZYRTEC) 10 MG tablet Take 10 mg by mouth daily.     diclofenac (VOLTAREN) 75 MG EC tablet Take  1 tablet (75 mg total) by mouth 2 (two) times daily. 30 tablet 0   famotidine (PEPCID) 20 MG tablet TAKE 1 TABLET BY MOUTH TWICE A DAY 180 tablet 1   hydrALAZINE (APRESOLINE) 25 MG tablet Take 1 tablet (25 mg total) by mouth daily at 10 pm. 90 tablet 3   L-Arginine 1000 MG TABS Take by mouth 2 (two) times daily.      Nebivolol HCl (BYSTOLIC) 20 MG TABS Take 1 tablet (20 mg total) by mouth daily. 90 tablet 3   tamsulosin (FLOMAX) 0.4 MG CAPS capsule Take 1 capsule by mouth once daily 90 capsule 3   No facility-administered medications prior to visit.   Allergies  Allergen Reactions   Gabapentin Other (See Comments)    Severe asthma  attack   Diltiazem Other (See Comments)    Edema   Celebrex [Celecoxib] Swelling   Penicillins     Was a baby when he had reaction--unknown     Objective:   Today's Vitals   07/12/23 0800  BP: 118/74  Pulse: 76  Temp: 98.4 F (36.9 C)  TempSrc: Temporal  SpO2: 96%  Weight: 223 lb 12.8 oz (101.5 kg)  Height: 5\' 9"  (1.753 m)   Body mass index is 33.05 kg/m.   General: Well developed, well nourished. No acute distress. Lungs: Clear to auscultation bilaterally. No wheezing, rales or rhonchi. CV: RRR without murmurs or rubs. Pulses 2+ bilaterally. Extremities: No edema noted. Psych: Alert and oriented. Normal mood and affect.  Health Maintenance Due  Topic Date Due   Pneumococcal Vaccine 18-8 Years old (1 of 2 - PCV) Never done   EKG: Sinus bradycardia (rate = 55)  Assessment & Plan:   Problem List Items Addressed This Visit       Cardiovascular and Mediastinum   Prinzmetal's angina [facility])   Mr. Betten is having an increased incidence of his vasospastic angina. I will check labs to assess for potential triggers. I also will check a troponin to make sure there is no sign of significant coronary ischemia. I recommend we try a long-acting nitrate to see if this will reduce his episodes. I will have him back in 2 weeks to reassess. He knows if he has an episode of angina lasting > 30 min, he should seek emergency evaluation.      Relevant Medications   isosorbide mononitrate (IMDUR) 60 MG 24 hr tablet   Other Relevant Orders   Magnesium   Comprehensive metabolic panel   Troponin I (High Sensitivity)     Other   Dyspnea   I do not see clear signs of heart failure. I will check some labs to assess for HF or other causes of his breathlessness.      Relevant Orders   Comprehensive metabolic panel   CBC   TSH   Brain natriuretic peptide   Other Visit Diagnoses       Chest pain, unspecified type    -  Primary   As above, this is likely vasospastic angina. EKG is  normal. I will check a troponin to make sure there is no sign of ischemia.   Relevant Orders   EKG 12-Lead   Comprehensive metabolic panel   Brain natriuretic peptide   Troponin I (High Sensitivity)       Return in about 2 weeks (around 07/26/2023) for Reassessment.   Loyola Mast, MD

## 2023-07-28 ENCOUNTER — Encounter: Payer: Self-pay | Admitting: Family Medicine

## 2023-07-28 ENCOUNTER — Ambulatory Visit: Payer: 59 | Admitting: Family Medicine

## 2023-07-28 VITALS — BP 118/74 | HR 56 | Temp 97.9°F | Ht 69.0 in | Wt 220.6 lb

## 2023-07-28 DIAGNOSIS — I201 Angina pectoris with documented spasm: Secondary | ICD-10-CM

## 2023-07-28 NOTE — Progress Notes (Signed)
 [facility] [address] [place] [address] Dept: [phone] Dept Fax: [phone]  Office Visit  Subjective:    Patient ID: Daniel Zimmerman, male    DOB: 1960-10-20, 63 y.o..   MRN: [id number]  Chief Complaint  Patient presents with   Follow-up   History of Present Illness:  Patient is in today for reassessment of his angina. Daniel Zimmerman has a well-established history of vasospastic angina. He has had daily episodes for years, but in Mid-February he started having multiple episodes a day. He noted radiation of pain into his left arm, which had not been as typical. He noted increased lower leg edema developing during the day, though this was going down over night. He had increased dyspnea, particularly with climbing stairs. He had also experienced a gurgling in his breathing while sleeping. He had been previously treated with diltiazem, but had issues with worsening lower leg edema. He has experienced significant headache with taking short-acting nitroglycerin int he past. I evaluated him on 07/12/2023. He had no signs of an acute coronary event. I prescribed isosorbide mononitrate. Daniel Zimmerman notes that he developed a significant headache on this medicine. After a few days, he started breaking his pills in half, but still had the headache, so he has stopped the medication.  Past Medical History: Patient Active Problem List   Diagnosis Date Noted   Diverticulosis 09/30/2022   Perennial allergic rhinitis 07/01/2022   Rectal bleeding 06/10/2022   Mild persistent asthma 06/10/2022   Dyspnea 03/29/2022   Prediabetes 08/24/2021   Osteoarthritis of fingers of both hands 08/10/2021   Radiculopathy of lumbar region 05/02/2018   Irreducible umbilical hernia 05/02/2018   Lower urinary tract symptoms (LUTS) 05/02/2018   Hyperlipidemia 05/01/2017   GERD (gastroesophageal reflux disease) 05/01/2017   Essential hypertension 06/01/2015    Prinzmetal's angina (HCC) 06/01/2015   Past Surgical History:  Procedure Laterality Date   COLONOSCOPY     ROTATOR CUFF REPAIR Right    SPINE SURGERY     Cervical--ruptured disc   UMBILICAL HERNIA REPAIR  2019   WRIST SURGERY Left    Family History  Problem Relation Age of Onset   Cancer Mother        Kidney   Arthritis Mother    Hyperlipidemia Mother    Hypertension Mother    Skin cancer Mother        unknown   Arthritis Father    COPD Father    Heart attack Maternal Uncle    Hypertension Maternal Uncle    Arthritis Maternal Grandmother    Hypertension Maternal Grandmother    Heart attack Maternal Grandmother 3   Arthritis Maternal Grandfather    Arthritis Paternal Grandmother    Heart disease Paternal Grandmother    Arthritis Paternal Grandfather    Prostate cancer Paternal Grandfather 60   Outpatient Medications Prior to Visit  Medication Sig Dispense Refill   albuterol (VENTOLIN HFA) 108 (90 Base) MCG/ACT inhaler Inhale 2 puffs into the lungs every 6 (six) hours as needed. 9 g 5   atorvastatin (LIPITOR) 40 MG tablet Take 1 tablet (40 mg total) by mouth daily. 90 tablet 3   budesonide-formoterol (SYMBICORT) 160-4.5 MCG/ACT inhaler Inhale 2 puffs into the lungs 2 (two) times daily. 1 each 3   cetirizine (ZYRTEC) 10 MG tablet Take 10 mg by mouth daily.     diclofenac (VOLTAREN) 75 MG EC tablet Take 1 tablet (75 mg total) by mouth 2 (two) times daily.  30 tablet 0   famotidine (PEPCID) 20 MG tablet TAKE 1 TABLET BY MOUTH TWICE A DAY 180 tablet 1   hydrALAZINE (APRESOLINE) 25 MG tablet Take 1 tablet (25 mg total) by mouth daily at 10 pm. 90 tablet 3   L-Arginine 1000 MG TABS Take by mouth 2 (two) times daily.      Nebivolol HCl (BYSTOLIC) 20 MG TABS Take 1 tablet (20 mg total) by mouth daily. 90 tablet 3   tamsulosin (FLOMAX) 0.4 MG CAPS capsule Take 1 capsule by mouth once daily 90 capsule 3   isosorbide mononitrate (IMDUR) 60 MG 24 hr tablet Take 1 tablet (60 mg total)  by mouth daily. (Patient not taking: Reported on 07/28/2023) 30 tablet 2   No facility-administered medications prior to visit.   Allergies  Allergen Reactions   Gabapentin Other (See Comments)    Severe asthma attack   Diltiazem Other (See Comments)    Edema   Isosorbide Dinitrate Other (See Comments)    Headache   Celebrex [Celecoxib] Swelling   Penicillins     Was a baby when he had reaction--unknown     Objective:   Today's Vitals   07/28/23 0858  BP: 118/74  Pulse: (!) 56  Temp: 97.9 F (36.6 C)  TempSrc: Temporal  SpO2: 95%  Weight: 220 lb 9.6 oz (100.1 kg)  Height: 5\' 9"  (1.753 m)   Body mass index is 32.58 kg/m.   General: Well developed, well nourished. No acute distress. Psych: Alert and oriented. Normal mood and affect.  Health Maintenance Due  Topic Date Due   Pneumococcal Vaccine 92-92 Years old (1 of 2 - PCV) Never done   Lab Results    Latest Ref Rng & Units 07/12/2023    8:48 AM 05/11/2020   11:15 AM 05/02/2018   10:13 AM  CBC  WBC 4.0 - 10.5 K/uL 6.1  7.6  6.0   Hemoglobin 13.0 - 17.0 g/dL 96.0  45.4  09.8   Hematocrit 39.0 - 52.0 % 46.8  47.7  47.5   Platelets 150.0 - 400.0 K/uL 199.0  226.0  231.0        Latest Ref Rng & Units 07/12/2023    8:48 AM 12/05/2022    9:43 AM 06/01/2021    7:50 AM  CMP  Glucose 70 - 99 mg/dL 119  147  829   BUN 6 - 23 mg/dL 13  11  9    Creatinine 0.40 - 1.50 mg/dL 5.62  1.30  8.65   Sodium 135 - 145 mEq/L 139  138  140   Potassium 3.5 - 5.1 mEq/L 4.4  4.6  4.3   Chloride 96 - 112 mEq/L 104  105  105   CO2 19 - 32 mEq/L 27  25  27    Calcium 8.4 - 10.5 mg/dL 8.8  8.7  9.0   Total Protein 6.0 - 8.3 g/dL 6.7   6.6   Total Bilirubin 0.2 - 1.2 mg/dL 0.7   0.5   Alkaline Phos 39 - 117 U/L 77   57   AST 0 - 37 U/L 28   20   ALT 0 - 53 U/L 62   33    Last thyroid functions Lab Results  Component Value Date   TSH 1.31 07/12/2023   Component Ref Range & Units (hover) 2 wk ago  Pro B Natriuretic peptide (BNP)  22.0   Component Ref Range & Units (hover) 2 wk ago  Magnesium 1.9  Assessment & Plan:   Problem List Items Addressed This Visit       Cardiovascular and Mediastinum   Prinzmetal's angina [facility]) - Primary   Daniel Zimmerman has recently experienced an increase incidence of his vasospastic angina. He has been intolerant of diltiazem (CCB) and both short- and long-acting nitrates. He asks about increasing his L-arginine dosage. I have no experience to draw on to tell him if this will help or not. He does have an upcoming visit with cardiology. We will continue to manage this expectantly until then.       Return for Follow-up as scheduled.   Loyola Mast, MD

## 2023-07-28 NOTE — Assessment & Plan Note (Signed)
 Daniel Zimmerman has recently experienced an increase incidence of his vasospastic angina. He has been intolerant of diltiazem (CCB) and both short- and long-acting nitrates. He asks about increasing his L-arginine dosage. I have no experience to draw on to tell him if this will help or not. He does have an upcoming visit with cardiology. We will continue to manage this expectantly until then.

## 2023-09-10 NOTE — Progress Notes (Signed)
 " Cardiology Office Note:  .   Date:  09/11/2023  ID:  Daniel Zimmerman, DOB 1960/12/09, MRN [id number] PCP: Thedora Garnette HERO, MD  [facility] Providers Cardiologist:  Gordy Bergamo, MD   History of Present Illness: .   Daniel Zimmerman is a 63 y.o. Caucasian male patient was seen about 4 years ago for management of hypertension which is extremely sensitive due to variation in blood pressure, hypercholesterolemia, reactive airway disease with mild bronchial asthma, has a diagnosis of Prinzmetal's angina recently and was tried on Imdur  but caused headaches and hence referred back to me, presents to reestablish care.   On Bystolic  20 mg daily and hydralazine  as needed he had done well.  Echocardiogram in 21 had revealed normal LV systolic function without significant valvular abnormality and his last stress test was in 2017 which was nonischemic and low risk.  Discussed the use of AI scribe software for clinical note transcription with the patient, who gave verbal consent to proceed.  History of Present Illness The patient, with a history of asthma and hypertension, presents with increasing episodes of chest tightness and palpitations over the past six to eight months. The chest tightness, described as muscle cramp-like, lasts for one to three minutes, located in the left lateral part of the chest and is not related to physical exertion. The patient reports that these episodes occur more frequently when his blood pressure is high. Despite these symptoms, the patient is able to maintain his daily activities, including walking three to five miles a day at work. The patient also reports occasional shortness of breath and leg swelling. The patient's blood pressure is generally well-controlled with Bystolic  and hydralazine , and his cholesterol is well-managed with atorvastatin . The patient uses albuterol  daily for asthma management.  He also complains of occasional chest discomfort and notices skipping like  sensation in his chest and EKG recordings at home on his smart watch revealed occasional PACs.  No sustained episodes and most of these episodes occur at rest.  Labs   Lab Results  Component Value Date   CHOL 117 12/05/2022   HDL 43.00 12/05/2022   LDLCALC 63 12/05/2022   TRIG 54.0 12/05/2022   CHOLHDL 3 12/05/2022   Lab Results  Component Value Date   NA 139 07/12/2023   K 4.4 07/12/2023   CO2 27 07/12/2023   GLUCOSE 103 (H) 07/12/2023   BUN 13 07/12/2023   CREATININE 0.94 07/12/2023   CALCIUM  8.8 07/12/2023   GFR 87.51 07/12/2023      Latest Ref Rng & Units 07/12/2023    8:48 AM 12/05/2022    9:43 AM 06/01/2021    7:50 AM  BMP  Glucose 70 - 99 mg/dL 896  888  883   BUN 6 - 23 mg/dL 13  11  9    Creatinine 0.40 - 1.50 mg/dL 9.05  9.08  8.96   Sodium 135 - 145 mEq/L 139  138  140   Potassium 3.5 - 5.1 mEq/L 4.4  4.6  4.3   Chloride 96 - 112 mEq/L 104  105  105   CO2 19 - 32 mEq/L 27  25  27    Calcium  8.4 - 10.5 mg/dL 8.8  8.7  9.0       Latest Ref Rng & Units 07/12/2023    8:48 AM 05/11/2020   11:15 AM 05/02/2018   10:13 AM  CBC  WBC 4.0 - 10.5 K/uL 6.1  7.6  6.0   Hemoglobin 13.0 -  17.0 g/dL 84.0  83.5  83.8   Hematocrit 39.0 - 52.0 % 46.8  47.7  47.5   Platelets 150.0 - 400.0 K/uL 199.0  226.0  231.0    Lab Results  Component Value Date   HGBA1C 6.1 12/05/2022    Lab Results  Component Value Date   TSH 1.31 07/12/2023   Review of Systems  Cardiovascular:  Positive for chest pain, dyspnea on exertion and palpitations. Negative for leg swelling.   Physical Exam:   VS:  BP 108/72   Pulse (!) 56   Ht 5' 10 (1.778 m)   Wt 219 lb 9.6 oz (99.6 kg)   SpO2 97%   BMI 31.51 kg/m    Wt Readings from Last 3 Encounters:  09/11/23 219 lb 9.6 oz (99.6 kg)  07/28/23 220 lb 9.6 oz (100.1 kg)  07/12/23 223 lb 12.8 oz (101.5 kg)    Physical Exam Constitutional:      Appearance: He is obese.  Neck:     Vascular: No carotid bruit or JVD.  Cardiovascular:      Rate and Rhythm: Normal rate and regular rhythm.     Pulses: Intact distal pulses.     Heart sounds: Normal heart sounds. No murmur heard.    No gallop.  Pulmonary:     Effort: Pulmonary effort is normal.     Breath sounds: Normal breath sounds.  Abdominal:     General: Bowel sounds are normal.     Palpations: Abdomen is soft.  Musculoskeletal:     Right lower leg: No edema.     Left lower leg: No edema.    Studies Reviewed: SABRA     EKG:    EKG Interpretation Date/Time:  Monday September 11 2023 13:31:08 EDT Ventricular Rate:  59 PR Interval:  188 QRS Duration:  90 QT Interval:  412 QTC Calculation: 407 R Axis:   -23  Text Interpretation: EKG 09/11/2023: Normal sinus rhythm at rate of 59 bpm, left anterior fascicular block.  Incomplete right bundle branch block.  T wave abnormality in V1-V4, nonspecific however cannot exclude ischemia.  Compared to 01/10/2020 T wave inversion new. Confirmed by Tempie Gibeault, Jagadeesh ([id number]) on 09/11/2023 1:49:59 PM    Medications and allergies    Allergies  Allergen Reactions   Gabapentin  Other (See Comments)    Severe asthma attack   Diltiazem  Other (See Comments)    Edema   Isosorbide  Dinitrate Other (See Comments)    Headache   Celebrex [Celecoxib] Swelling   Penicillins     Was a baby when he had reaction--unknown     Current Outpatient Medications:    atorvastatin  (LIPITOR) 40 MG tablet, Take 1 tablet (40 mg total) by mouth daily., Disp: 90 tablet, Rfl: 3   budesonide -formoterol  (SYMBICORT ) 160-4.5 MCG/ACT inhaler, Inhale 2 puffs into the lungs 2 (two) times daily., Disp: 1 each, Rfl: 3   cetirizine (ZYRTEC) 10 MG tablet, Take 10 mg by mouth daily., Disp: , Rfl:    diclofenac  (VOLTAREN ) 75 MG EC tablet, Take 1 tablet (75 mg total) by mouth 2 (two) times daily., Disp: 30 tablet, Rfl: 0   diltiazem  (CARDIZEM ) 30 MG tablet, Take one tablet day before CT scan and one tablet 2 hours prior to  CT scan, Disp: 2 tablet, Rfl: 0   famotidine  (PEPCID )  20 MG tablet, TAKE 1 TABLET BY MOUTH TWICE A DAY, Disp: 180 tablet, Rfl: 1   hydrALAZINE  (APRESOLINE ) 25 MG tablet, Take 1 tablet (25 mg total) by  mouth daily at 10 pm., Disp: 90 tablet, Rfl: 3   L-Arginine 1000 MG TABS, Take by mouth 2 (two) times daily. , Disp: , Rfl:    levalbuterol  (XOPENEX  HFA) 45 MCG/ACT inhaler, Inhale 1 puff into the lungs every 4 (four) hours as needed for wheezing or shortness of breath., Disp: 1 each, Rfl: 0   Nebivolol  HCl (BYSTOLIC ) 20 MG TABS, Take 1 tablet (20 mg total) by mouth daily., Disp: 90 tablet, Rfl: 3   tamsulosin  (FLOMAX ) 0.4 MG CAPS capsule, Take 1 capsule by mouth once daily, Disp: 90 capsule, Rfl: 3   Meds ordered this encounter  Medications   levalbuterol  (XOPENEX  HFA) 45 MCG/ACT inhaler    Sig: Inhale 1 puff into the lungs every 4 (four) hours as needed for wheezing or shortness of breath.    Dispense:  1 each    Refill:  0   diltiazem  (CARDIZEM ) 30 MG tablet    Sig: Take one tablet day before CT scan and one tablet 2 hours prior to  CT scan    Dispense:  2 tablet    Refill:  0     Medications Discontinued During This Encounter  Medication Reason   albuterol  (VENTOLIN  HFA) 108 (90 Base) MCG/ACT inhaler Change in therapy     ASSESSMENT AND PLAN: .      ICD-10-CM   1. Tightness in chest  R07.89 Basic Metabolic Panel (BMET)    2. Nonspecific abnormal electrocardiogram (ECG) (EKG)  R94.31 Basic Metabolic Panel (BMET)    3. Palpitations  R00.2     4. Dyspnea on exertion  R06.09 levalbuterol  (XOPENEX  HFA) 45 MCG/ACT inhaler    Basic Metabolic Panel (BMET)    5. Primary hypertension  I10 EKG 12-Lead    6. Precordial pain  R07.2 Basic Metabolic Panel (BMET)    CT CORONARY MORPH W/CTA COR W/SCORE W/CA W/CM &/OR WO/CM      Assessment and Plan Assessment & Plan Palpitations and chest tightness   He experiences intermittent chest tightness and palpitations for 6-8 months, occurring at rest, with symptoms of heart skipping beats,  shortness of breath, and musculoskeletal pain. EKG shows occasional PACs and T wave inversions. Differential diagnosis includes musculoskeletal pain and potential cardiac issues. Albuterol  use may contribute to symptoms.   A coronary CT angiogram is recommended to assess for coronary artery disease. If no blockage is found, no concern is necessary. If blockage is present, further management will be considered. Emphasized the importance of controlling risk factors such as blood pressure and cholesterol. Discontinue albuterol  and initiate Xopenex  for asthma management. Administer diltiazem  30 mg one day before and on the day of the CT scan to lower heart rate.  Asthma   Asthma management includes daily albuterol , which may contribute to palpitations and increased heart rate. Symbicort  is used without adverse effects on heart rate. Xopenex  is recommended as an alternative to avoid cardiovascular side effects. Discontinue albuterol  and initiate Xopenex  for asthma management. Continue Symbicort  as prescribed.  Hypertension   Hypertension is well-controlled with nebivolol  (Bystolic ) 20 mg and hydralazine  as needed. Occasional elevated blood pressure is noted, but overall management is effective. Continue nebivolol  (Bystolic ) 20 mg daily. Continue hydralazine  as needed for blood pressure control.  Hypercholesterolemia Patient is presently on high intensity high-dose atorvastatin  40 mg daily with excellent control of his lipids.  I have extensively discussed with the patient and his wife as long as we know that he does not have life-threatening conditions like significant coronary artery disease,  symptoms could be lifestyle limiting with occasional episodes of sharp chest pains that he has had for several years since I had previously seen him about 4 to 5 years ago.  Coronary CT angiogram reviewed reassuring especially with new EKG changes in the anterior leads suggestive of LAD territory involvement.  The  diagnosis of Prinzmetal's angina appears questionable.  His symptoms do not suggest variant angina.  Unless the tests are abnormal, I will see him back on a as needed basis, patient and his wife feel reassured.  Signed,  Gordy Bergamo, MD, [facility] 09/11/2023, 2:19 PM [facility] [address] [place], [place] [address] Phone: [phone]. Fax:  [phone]  "

## 2023-09-11 ENCOUNTER — Ambulatory Visit: Payer: 59 | Attending: Cardiology | Admitting: Cardiology

## 2023-09-11 ENCOUNTER — Encounter: Payer: Self-pay | Admitting: Cardiology

## 2023-09-11 VITALS — BP 108/72 | HR 56 | Ht 70.0 in | Wt 219.6 lb

## 2023-09-11 DIAGNOSIS — R9431 Abnormal electrocardiogram [ECG] [EKG]: Secondary | ICD-10-CM

## 2023-09-11 DIAGNOSIS — R0609 Other forms of dyspnea: Secondary | ICD-10-CM | POA: Diagnosis not present

## 2023-09-11 DIAGNOSIS — R002 Palpitations: Secondary | ICD-10-CM | POA: Diagnosis not present

## 2023-09-11 DIAGNOSIS — R072 Precordial pain: Secondary | ICD-10-CM

## 2023-09-11 DIAGNOSIS — R0789 Other chest pain: Secondary | ICD-10-CM | POA: Diagnosis not present

## 2023-09-11 DIAGNOSIS — I10 Essential (primary) hypertension: Secondary | ICD-10-CM

## 2023-09-11 DIAGNOSIS — E7800 Pure hypercholesterolemia, unspecified: Secondary | ICD-10-CM

## 2023-09-11 MED ORDER — LEVALBUTEROL TARTRATE 45 MCG/ACT IN AERO: 1.0000 | INHALATION_SPRAY | RESPIRATORY_TRACT | 0 refills | Status: DC | PRN

## 2023-09-11 MED ORDER — DILTIAZEM HCL 30 MG PO TABS: ORAL_TABLET | ORAL | 0 refills | Status: DC

## 2023-09-11 NOTE — Patient Instructions (Addendum)
 Medication Instructions:  Your physician recommends that you continue on your current medications as directed. Please refer to the Current Medication list given to you today.  *If you need a refill on your cardiac medications before your next appointment, please call your pharmacy*  Lab Work: Have lab work checked today at [facility] on the first floor--BMP If you have labs (blood work) drawn today and your tests are completely normal, you will receive your results only by: [employer] (if you have MyChart) OR A paper copy in the mail If you have any lab test that is abnormal or we need to change your treatment, we will call you to review the results.  Testing/Procedures: Your physician has requested that you have cardiac CT. Cardiac computed tomography (CT) is a painless test that uses an x-ray machine to take clear, detailed pictures of your heart. For further information please visit [email or web address] Please follow instruction sheet as given.    Follow-Up: At [facility], you and your health needs are our priority.  As part of our continuing mission to provide you with exceptional heart care, our providers are all part of one team.  This team includes your primary Cardiologist (physician) and Advanced Practice Providers or APPs (Physician Assistants and Nurse Practitioners) who all work together to provide you with the care you need, when you need it.  Your next appointment:   As needed  Provider:   Knox Perl, MD    We recommend signing up for the patient portal called "MyChart".  Sign up information is provided on this After Visit Summary.  MyChart is used to connect with patients for Virtual Visits (Telemedicine).  Patients are able to view lab/test results, encounter notes, upcoming appointments, etc.  Non-urgent messages can be sent to your provider as well.   To learn more about what you can do with MyChart, go to [email or web address].   Other  Instructions   Your cardiac CT will be scheduled at one of the below locations:   [facility] [address] [place], [place] [address] [phone]  OR  [facility] [address] [place], [place] [address] [phone]  OR   [facility] [address] [place], [place] [address] [phone]  OR   [facility] [address] [place], [place] [address] [phone]  OR   [facility] V[facility] Tower [address]  [place], [place] [address] Opening September 11, 2023  If scheduled at [facility], please arrive at the [facility] and Children's Entrance (Entrance C2) of [facility] 30 minutes prior to test start time. You can use the FREE valet parking offered at entrance C (encouraged to control the heart rate for the test)  Proceed to the [facility] Radiology Department (first floor) to check-in and test prep.   All radiology patients and guests should use entrance C2 at [facility], accessed from [facility], even though the hospital's physical address listed is [address]    If scheduled at the Heart and Vascular Tower at [address], please enter the parking lot using the [address] entrance and use the FREE valet service at the patient drop-off area. Enter the buidling and check-in with registration on the main floor.  If scheduled at [facility] or [facility], please arrive 15 mins early for check-in and test prep.  There is spacious parking  and easy access to the radiology department from the [facility] Heart and Vascular entrance. Please enter here and check-in with the desk attendant.   If scheduled at [facility], please arrive 30 minutes early for check-in and test prep.  Please follow these instructions carefully (unless otherwise  directed):  An IV will be required for this test and Nitroglycerin will be given.  Hold all erectile dysfunction medications at least 3 days (72 hrs) prior to test. (Ie viagra, cialis, sildenafil, tadalafil, etc)   On the Night Before the Test: Be sure to Drink plenty of water. Do not consume any caffeinated/decaffeinated beverages or chocolate 12 hours prior to your test. Do not take any antihistamines 12 hours prior to your test.   On the Day of the Test: Drink plenty of water until 1 hour prior to the test. Do not eat any food 1 hour prior to test. You may take your regular medications prior to the test.  Take cardizem  30 mg day before CT scan and 2 hours prior to CT scan If you take Furosemide/Hydrochlorothiazide/Spironolactone/Chlorthalidone, please HOLD on the morning of the test. Patients who wear a continuous glucose monitor MUST remove the device prior to scanning.          After the Test: Drink plenty of water. After receiving IV contrast, you may experience a mild flushed feeling. This is normal. On occasion, you may experience a mild rash up to 24 hours after the test. This is not dangerous. If this occurs, you can take Benadryl 25 mg, Zyrtec, Claritin, or Allegra and increase your fluid intake. (Patients taking Tikosyn should avoid Benadryl, and may take Zyrtec, Claritin, or Allegra) If you experience trouble breathing, this can be serious. If it is severe call 911 IMMEDIATELY. If it is mild, please call our office.  We will call to schedule your test 2-4 weeks out understanding that some insurance companies will need an authorization prior to the service being performed.   For more information and frequently asked questions, please visit our website : [email or web address]  For non-scheduling related questions, please contact the cardiac imaging nurse navigator should you have any questions/concerns: Cardiac Imaging Nurse Navigators Direct Office Dial:  [phone]   For scheduling needs, including cancellations and rescheduling, please call [place], [phone].

## 2023-09-12 ENCOUNTER — Encounter: Payer: Self-pay | Admitting: Cardiology

## 2023-09-12 LAB — BASIC METABOLIC PANEL WITH GFR
BUN/Creatinine Ratio: 10 (ref 10–24)
BUN: 10 mg/dL (ref 8–27)
CO2: 24 mmol/L (ref 20–29)
Calcium: 9.2 mg/dL (ref 8.6–10.2)
Chloride: 103 mmol/L (ref 96–106)
Creatinine, Ser: 0.98 mg/dL (ref 0.76–1.27)
Glucose: 96 mg/dL (ref 70–99)
Potassium: 5.1 mmol/L (ref 3.5–5.2)
Sodium: 140 mmol/L (ref 134–144)
eGFR: 88 mL/min/{1.73_m2} (ref >59)

## 2023-09-12 NOTE — Progress Notes (Signed)
 Normal kidney function for CT Scan. Proceed with the test

## 2023-09-13 ENCOUNTER — Other Ambulatory Visit: Payer: Self-pay | Admitting: Family Medicine

## 2023-09-13 DIAGNOSIS — I10 Essential (primary) hypertension: Secondary | ICD-10-CM

## 2023-09-13 DIAGNOSIS — I201 Angina pectoris with documented spasm: Secondary | ICD-10-CM

## 2023-10-12 ENCOUNTER — Other Ambulatory Visit: Payer: Self-pay | Admitting: Family

## 2023-10-12 ENCOUNTER — Other Ambulatory Visit: Payer: Self-pay | Admitting: Family Medicine

## 2023-10-12 DIAGNOSIS — I201 Angina pectoris with documented spasm: Secondary | ICD-10-CM

## 2023-10-12 DIAGNOSIS — E7800 Pure hypercholesterolemia, unspecified: Secondary | ICD-10-CM

## 2023-10-12 DIAGNOSIS — I10 Essential (primary) hypertension: Secondary | ICD-10-CM

## 2023-10-12 DIAGNOSIS — R399 Unspecified symptoms and signs involving the genitourinary system: Secondary | ICD-10-CM

## 2023-10-19 ENCOUNTER — Other Ambulatory Visit: Payer: Self-pay

## 2023-10-19 DIAGNOSIS — I10 Essential (primary) hypertension: Secondary | ICD-10-CM

## 2023-10-19 DIAGNOSIS — I201 Angina pectoris with documented spasm: Secondary | ICD-10-CM

## 2023-10-19 MED ORDER — NEBIVOLOL HCL 20 MG PO TABS: 1.0000 | ORAL_TABLET | Freq: Every day | ORAL | 0 refills | Status: DC

## 2023-12-08 ENCOUNTER — Ambulatory Visit (INDEPENDENT_AMBULATORY_CARE_PROVIDER_SITE_OTHER): Payer: 59 | Admitting: Family Medicine

## 2023-12-08 VITALS — BP 114/76 | HR 59 | Temp 96.7°F | Ht 70.0 in | Wt 219.0 lb

## 2023-12-08 DIAGNOSIS — I10 Essential (primary) hypertension: Secondary | ICD-10-CM | POA: Diagnosis not present

## 2023-12-08 DIAGNOSIS — Z0000 Encounter for general adult medical examination without abnormal findings: Secondary | ICD-10-CM

## 2023-12-08 DIAGNOSIS — E782 Mixed hyperlipidemia: Secondary | ICD-10-CM

## 2023-12-08 DIAGNOSIS — J4530 Mild persistent asthma, uncomplicated: Secondary | ICD-10-CM | POA: Diagnosis not present

## 2023-12-08 DIAGNOSIS — K219 Gastro-esophageal reflux disease without esophagitis: Secondary | ICD-10-CM

## 2023-12-08 DIAGNOSIS — R7303 Prediabetes: Secondary | ICD-10-CM

## 2023-12-08 DIAGNOSIS — R399 Unspecified symptoms and signs involving the genitourinary system: Secondary | ICD-10-CM

## 2023-12-08 MED ORDER — OMEPRAZOLE 20 MG PO CPDR: 20.0000 mg | DELAYED_RELEASE_CAPSULE | Freq: Every day | ORAL | 3 refills | Status: DC

## 2023-12-08 MED ORDER — LEVALBUTEROL TARTRATE 45 MCG/ACT IN AERO: 1.0000 | INHALATION_SPRAY | RESPIRATORY_TRACT | 0 refills | Status: DC | PRN

## 2023-12-08 NOTE — Assessment & Plan Note (Signed)
I will check his annual A1c today.

## 2023-12-08 NOTE — Assessment & Plan Note (Signed)
 LUTS symptoms improved. Continue tamsulosin  0.4 mg at bedtime

## 2023-12-08 NOTE — Assessment & Plan Note (Signed)
 Blood pressure is in good control. Continue hydralazine 25 mg daily and nebivolol 20 mg daily.

## 2023-12-08 NOTE — Assessment & Plan Note (Signed)
 Overall health is good. Recommend regular exercise. Discussed recommended screenings and immunizations. Daniel Zimmerman refuses pneumococcal vaccination.

## 2023-12-08 NOTE — Assessment & Plan Note (Signed)
 Stable. Continue Symbicort  160-4.5 mcg/ACT inhaler to use 2 puffs bid and levalbuterol  PRN.

## 2023-12-08 NOTE — Assessment & Plan Note (Signed)
 I will try switching Mr. Bevard to omeprazole 20 mg daily.

## 2023-12-08 NOTE — Progress Notes (Signed)
 [facility] [address] [place] [address] Dept: [phone] Dept Fax: [phone]  Annual Physical Visit  Subjective:    Patient ID: Daniel Zimmerman, male    DOB: 1961-04-19, 63 y.o..   MRN: [id number]  Chief Complaint  Patient presents with   Annual Exam   History of Present Illness:  Patient is in today for an annual physical/preventative visit.  Daniel Zimmerman has a history of essential hypertension and Prinzmetal angina. He is managed on hydralazine  25 mg daily, nebivolol  20 mg daily and takes an L-arginine supplement. He had been experiencing more episodes of angina this past Winter. Cardiology felt his albuterol  may have been causing some increased palpitaitons, so recommended he switch to levalbuterol  (Xopenox).   Daniel Zimmerman has a history of mild, persistent asthma. He is managed on Symbicort  80-4.5 mcg/ACT 2 puffs bid. He is also on PRN levalbuterol .   Review of Systems  Constitutional:  Negative for chills, diaphoresis, fever, malaise/fatigue and weight loss.  HENT:  Negative for congestion, ear pain, hearing loss, sinus pain, sore throat and tinnitus.   Eyes:  Negative for blurred vision, pain, discharge and redness.  Respiratory:  Negative for cough, shortness of breath and wheezing.        As above.  Cardiovascular:  Positive for chest pain. Negative for palpitations.       As above.  Gastrointestinal:  Positive for heartburn. Negative for abdominal pain, constipation, diarrhea, nausea and vomiting.       Notes daily heartburn. Has been using famotidine  20 mg bid, but still has breakthrough symptoms.  Musculoskeletal:  Positive for joint pain. Negative for back pain and myalgias.       History of OA, esp. of finger and wrist joints. Feels he is managing with this.  Skin:  Negative for itching and rash.  Psychiatric/Behavioral:  Negative for depression. The patient is not nervous/anxious.     Past Medical  History: Patient Active Problem List   Diagnosis Date Noted   Diverticulosis 09/30/2022   Perennial allergic rhinitis 07/01/2022   Rectal bleeding 06/10/2022   Mild persistent asthma 06/10/2022   Dyspnea 03/29/2022   Prediabetes 08/24/2021   Osteoarthritis of fingers of both hands 08/10/2021   Radiculopathy of lumbar region 05/02/2018   Irreducible umbilical hernia 05/02/2018   Lower urinary tract symptoms (LUTS) 05/02/2018   Hyperlipidemia 05/01/2017   GERD (gastroesophageal reflux disease) 05/01/2017   Essential hypertension 06/01/2015   Prinzmetal's angina (HCC) 06/01/2015   Past Surgical History:  Procedure Laterality Date   COLONOSCOPY     ROTATOR CUFF REPAIR Right    SPINE SURGERY     Cervical--ruptured disc   UMBILICAL HERNIA REPAIR  2019   WRIST SURGERY Left    Family History  Problem Relation Age of Onset   Cancer Mother        Kidney   Arthritis Mother    Hyperlipidemia Mother    Hypertension Mother    Skin cancer Mother        unknown   Arthritis Father    COPD Father    Heart attack Maternal Uncle    Hypertension Maternal Uncle    Arthritis Maternal Grandmother    Hypertension Maternal Grandmother    Heart attack Maternal Grandmother 3   Arthritis Maternal Grandfather    Arthritis Paternal Grandmother    Heart disease Paternal Grandmother    Arthritis Paternal Grandfather    Prostate cancer Paternal Grandfather 58   Outpatient Medications Prior  to Visit  Medication Sig Dispense Refill   atorvastatin  (LIPITOR) 40 MG tablet Take 1 tablet by mouth once daily 90 tablet 3   budesonide -formoterol  (SYMBICORT ) 160-4.5 MCG/ACT inhaler Inhale 2 puffs into the lungs 2 (two) times daily. 1 each 3   cetirizine (ZYRTEC) 10 MG tablet Take 10 mg by mouth daily.     diclofenac  (VOLTAREN ) 75 MG EC tablet Take 1 tablet (75 mg total) by mouth 2 (two) times daily. 30 tablet 0   hydrALAZINE  (APRESOLINE ) 25 MG tablet Take 1 tablet (25 mg total) by mouth daily at 10 pm. 90  tablet 3   L-Arginine 1000 MG TABS Take by mouth 2 (two) times daily.      Nebivolol  HCl 20 MG TABS Take 1 tablet (20 mg total) by mouth daily. 90 tablet 0   tamsulosin  (FLOMAX ) 0.4 MG CAPS capsule Take 1 capsule by mouth once daily 90 capsule 3   famotidine  (PEPCID ) 20 MG tablet TAKE 1 TABLET BY MOUTH TWICE A DAY 180 tablet 1   diltiazem  (CARDIZEM ) 30 MG tablet Take one tablet day before CT scan and one tablet 2 hours prior to  CT scan (Patient not taking: Reported on 12/08/2023) 2 tablet 0   levalbuterol  (XOPENEX  HFA) 45 MCG/ACT inhaler Inhale 1 puff into the lungs every 4 (four) hours as needed for wheezing or shortness of breath. (Patient not taking: Reported on 12/08/2023) 1 each 0   No facility-administered medications prior to visit.   Allergies  Allergen Reactions   Gabapentin  Other (See Comments)    Severe asthma attack   Diltiazem  Other (See Comments)    Edema   Isosorbide  Dinitrate Other (See Comments)    Headache   Celebrex [Celecoxib] Swelling   Penicillins     Was a baby when he had reaction--unknown   Objective:   Today's Vitals   12/08/23 0839  BP: 114/76  Pulse: (!) 59  Temp: (!) 96.7 F (35.9 C)  TempSrc: Axillary  SpO2: 94%  Weight: 219 lb (99.3 kg)  Height: 5' 10 (1.778 m)   Body mass index is 31.42 kg/m.   General: Well developed, well nourished. No acute distress. HEENT: Normocephalic, non-traumatic. PERRL, EOMI. Conjunctiva clear. External ears normal. EAC and   TMs normal bilaterally. Nose clear without congestion or rhinorrhea. Mucous membranes moist.   Oropharynx clear. Fair dentition. Neck: Supple. No lymphadenopathy. No thyromegaly. Lungs: Clear to auscultation bilaterally. No wheezing, rales or rhonchi. CV: RRR without murmurs or rubs. Pulses 2+ bilaterally. Abdomen: Soft, non-tender. Bowel sounds positive, normal pitch and frequency. No hepatosplenomegaly.   No rebound or guarding. Extremities: Full ROM. No joint swelling or tenderness. No  edema noted. Skin: Warm and dry. No rashes. Psych: Alert and oriented. Normal mood and affect.  There are no preventive care reminders to display for this patient.    Assessment & Plan:   Problem List Items Addressed This Visit       Cardiovascular and Mediastinum   Essential hypertension   Blood pressure is in good control. Continue hydralazine  25 mg daily and nebivolol  20 mg daily.         Respiratory   Mild persistent asthma   Stable. Continue Symbicort  160-4.5 mcg/ACT inhaler to use 2 puffs bid and levalbuterol  PRN.      Relevant Medications   levalbuterol  (XOPENEX  HFA) 45 MCG/ACT inhaler     Digestive   GERD (gastroesophageal reflux disease)   I will try switching Mr. Feil to omeprazole 20 mg daily.  Relevant Medications   omeprazole (PRILOSEC) 20 MG capsule     Other   Annual physical exam - Primary   Overall health is good. Recommend regular exercise. Discussed recommended screenings and immunizations. Mr. Stecher refuses pneumococcal vaccination.       Hyperlipidemia   I will check lipids today. Continue atorvastatin  40 mg daily.      Relevant Orders   Lipid panel   Lower urinary tract symptoms (LUTS)   LUTS symptoms improved. Continue tamsulosin  0.4 mg at bedtime       Prediabetes   I will check his annual A1c today.      Relevant Orders   Hemoglobin A1c    Return in about 4 months (around 04/09/2024) for Reassessment.   Garnette CHRISTELLA Simpler, MD

## 2023-12-08 NOTE — Assessment & Plan Note (Signed)
I will check lipids today. Continue atorvastatin 40 mg daily.

## 2024-01-04 ENCOUNTER — Other Ambulatory Visit: Payer: Self-pay | Admitting: Family Medicine

## 2024-01-04 DIAGNOSIS — J4530 Mild persistent asthma, uncomplicated: Secondary | ICD-10-CM

## 2024-01-11 ENCOUNTER — Other Ambulatory Visit: Payer: Self-pay | Admitting: Family Medicine

## 2024-01-11 DIAGNOSIS — I201 Angina pectoris with documented spasm: Secondary | ICD-10-CM

## 2024-01-11 DIAGNOSIS — I10 Essential (primary) hypertension: Secondary | ICD-10-CM

## 2024-03-27 ENCOUNTER — Other Ambulatory Visit: Payer: Self-pay | Admitting: Family Medicine

## 2024-03-27 DIAGNOSIS — K219 Gastro-esophageal reflux disease without esophagitis: Secondary | ICD-10-CM

## 2024-04-15 ENCOUNTER — Ambulatory Visit: Payer: Self-pay

## 2024-04-15 NOTE — Telephone Encounter (Signed)
 FYI Only or Action Required?: Action required by provider: medication refill request. Pt states that he gets this pulmonary issue every year about this time. He would like PCP to call in the same medications that he has call in before. No appts available in office . Pt will not go to UC.  Patient was last seen in primary care on 12/08/2023 by Thedora Garnette HERO, MD.  Called Nurse Triage reporting Shortness of Breath.  Symptoms began several weeks ago.  Interventions attempted: Prescription medications: inhaler.  Symptoms are: gradually worsening.  Triage Disposition: See HCP Within 4 Hours (Or PCP Triage)  Patient/caregiver understands and will follow disposition?: No, refuses disposition                    Copied from CRM #[id number]. Topic: Clinical - Red Word Triage >> Apr 15, 2024  4:02 PM Alfonso ORN wrote: Red Word that prompted transfer to Nurse Triage: bad cold gotten worse believes may be bronchitis Reason for Disposition  [1] MILD difficulty breathing (e.g., minimal/no SOB at rest, SOB with walking, pulse < 100) AND [2] NEW-onset or WORSE than normal  Answer Assessment - Initial Assessment Questions 1. RESPIRATORY STATUS: Describe your breathing? (e.g., wheezing, shortness of breath, unable to speak, severe coughing)      Mild SOB -  2. ONSET: When did this breathing problem begin?      1st week on November 3. PATTERN Does the difficult breathing come and go, or has it been constant since it started?      constant 4. SEVERITY: How bad is your breathing? (e.g., mild, moderate, severe)      mild 5. RECURRENT SYMPTOM: Have you had difficulty breathing before? If Yes, ask: When was the last time? and What happened that time?      Yes - yearly 6. CARDIAC HISTORY: Do you have any history of heart disease? (e.g., heart attack, angina, bypass surgery, angioplasty)      no 7. LUNG HISTORY: Do you have any history of lung disease?  (e.g., pulmonary embolus,  asthma, emphysema)     asthma 8. CAUSE: What do you think is causing the breathing problem?      Asthma + cold - thinks this is bronchitis 9. OTHER SYMPTOMS: Do you have any other symptoms? (e.g., chest pain, cough, dizziness, fever, runny nose)     Runny nose, itchy eyes 10. O2 level 91-92  Protocols used: Breathing Difficulty-A-AH

## 2024-04-16 NOTE — Telephone Encounter (Signed)
 Called and scheduled him for 04/17/24 @ 2:20 pm.  . Dm/cma

## 2024-04-17 ENCOUNTER — Ambulatory Visit: Admitting: Family Medicine

## 2024-04-17 ENCOUNTER — Ambulatory Visit

## 2024-04-17 ENCOUNTER — Encounter: Payer: Self-pay | Admitting: Family Medicine

## 2024-04-17 VITALS — BP 112/74 | HR 61 | Temp 97.7°F | Ht 70.0 in | Wt 214.4 lb

## 2024-04-17 DIAGNOSIS — I10 Essential (primary) hypertension: Secondary | ICD-10-CM

## 2024-04-17 DIAGNOSIS — J4531 Mild persistent asthma with (acute) exacerbation: Secondary | ICD-10-CM | POA: Diagnosis not present

## 2024-04-17 MED ORDER — BUDESONIDE-FORMOTEROL FUMARATE 160-4.5 MCG/ACT IN AERO: 2.0000 | INHALATION_SPRAY | Freq: Two times a day (BID) | RESPIRATORY_TRACT | 11 refills | Status: AC

## 2024-04-17 MED ORDER — PREDNISONE 20 MG PO TABS: ORAL_TABLET | ORAL | 0 refills | Status: AC

## 2024-04-17 MED ORDER — HYDRALAZINE HCL 25 MG PO TABS: 25.0000 mg | ORAL_TABLET | Freq: Every day | ORAL | 3 refills | Status: AC

## 2024-04-17 MED ORDER — LEVALBUTEROL TARTRATE 45 MCG/ACT IN AERO: 1.0000 | INHALATION_SPRAY | Freq: Four times a day (QID) | RESPIRATORY_TRACT | 11 refills | Status: AC | PRN

## 2024-04-17 NOTE — Assessment & Plan Note (Signed)
 Blood pressure is in good control. Continue nebivolol  20 mg daily. May use hydralazine  25 mg daily as needed for BP > 140/90.

## 2024-04-17 NOTE — Assessment & Plan Note (Signed)
 I will prescribe a tapering dose of prednisone  for the next 10 days. He should continue his use of Symbicort  and Xopenox as needed.

## 2024-04-17 NOTE — Progress Notes (Signed)
 [facility] [address] [place] [address] Dept: [phone] Dept Fax: [phone]  Office Visit  Subjective:    Patient ID: Daniel Zimmerman, male    DOB: [birth year], 63 y.o..   MRN: [id number]  Chief Complaint  Patient presents with   Cough    C/o having chest congestion/SOB x 1 week.  Has taken OTC cough medication.    History of Present Illness:  Patient is in today for shortness of breath. Daniel Zimmerman does have a history of mild persistent asthma. He manages this with Symbicort  80-4.5 mcg/ACT 2 puffs bid and levalbuterol  as needed. He notes he first developed signs of a respiratory illness about a month ago, when visiting [place], [place]. He feels his cough ash worsened int he last week. He is coughing up brownish sputum, noting this looks similar to what he is blowing out of his nose. He has been needing to use his Xopenox inhaler 5 times a day.  Daniel Zimmerman has a history of essential hypertension and Prinzmetal angina. He is managed on hydralazine  25 mg daily, nebivolol  20 mg daily and takes an L-arginine supplement. He notes he has been out of his hydralazine  for about a month and his blood pressure has been doing well.  Past Medical History: Patient Active Problem List   Diagnosis Date Noted   Diverticulosis 09/30/2022   Perennial allergic rhinitis 07/01/2022   Rectal bleeding 06/10/2022   Mild persistent asthma 06/10/2022   Dyspnea 03/29/2022   Prediabetes 08/24/2021   Osteoarthritis of fingers of both hands 08/10/2021   Radiculopathy of lumbar region 05/02/2018   Irreducible umbilical hernia 05/02/2018   Lower urinary tract symptoms (LUTS) 05/02/2018   Annual physical exam 05/01/2018   Hyperlipidemia 05/01/2017   GERD (gastroesophageal reflux disease) 05/01/2017   Essential hypertension 06/01/2015   Prinzmetal's angina 06/01/2015   Past Surgical History:  Procedure Laterality Date   COLONOSCOPY     ROTATOR CUFF  REPAIR Right    SPINE SURGERY     Cervical--ruptured disc   UMBILICAL HERNIA REPAIR  2019   WRIST SURGERY Left    Family History  Problem Relation Age of Onset   Cancer Mother        Kidney   Arthritis Mother    Hyperlipidemia Mother    Hypertension Mother    Skin cancer Mother        unknown   Arthritis Father    COPD Father    Heart attack Maternal Uncle    Hypertension Maternal Uncle    Arthritis Maternal Grandmother    Hypertension Maternal Grandmother    Heart attack Maternal Grandmother 57   Arthritis Maternal Grandfather    Arthritis Paternal Grandmother    Heart disease Paternal Grandmother    Arthritis Paternal Grandfather    Prostate cancer Paternal Grandfather 71   Outpatient Medications Prior to Visit  Medication Sig Dispense Refill   atorvastatin  (LIPITOR) 40 MG tablet Take 1 tablet by mouth once daily 90 tablet 3   budesonide -formoterol  (SYMBICORT ) 160-4.5 MCG/ACT inhaler Inhale 2 puffs into the lungs 2 (two) times daily. 1 each 3   cetirizine (ZYRTEC) 10 MG tablet Take 10 mg by mouth daily.     diclofenac  (VOLTAREN ) 75 MG EC tablet Take 1 tablet (75 mg total) by mouth 2 (two) times daily. 30 tablet 0   diltiazem  (CARDIZEM ) 30 MG tablet Take one tablet day before CT scan and one tablet 2 hours prior to  CT scan (Patient  not taking: Reported on 12/08/2023) 2 tablet 0   hydrALAZINE  (APRESOLINE ) 25 MG tablet Take 1 tablet (25 mg total) by mouth daily at 10 pm. 90 tablet 3   L-Arginine 1000 MG TABS Take by mouth 2 (two) times daily.      levalbuterol  (XOPENEX  HFA) 45 MCG/ACT inhaler INHALE 1 PUFF BY MOUTH EVERY 4 HOURS AS NEEDED FOR WHEEZING FOR SHORTNESS OF BREATH 15 g 0   Nebivolol  HCl 20 MG TABS Take 1 tablet by mouth once daily 90 tablet 3   omeprazole  (PRILOSEC) 20 MG capsule Take 1 capsule by mouth once daily 90 capsule 3   tamsulosin  (FLOMAX ) 0.4 MG CAPS capsule Take 1 capsule by mouth once daily 90 capsule 3   No facility-administered medications prior to  visit.   Allergies  Allergen Reactions   Gabapentin  Other (See Comments)    Severe asthma attack   Diltiazem  Other (See Comments)    Edema   Isosorbide  Dinitrate Other (See Comments)    Headache   Celebrex [Celecoxib] Swelling   Penicillins     Was a baby when he had reaction--unknown     Objective:   Today's Vitals   04/17/24 1415  BP: 112/74  Pulse: 61  Temp: 97.7 F (36.5 C)  TempSrc: Temporal  SpO2: 98%  Weight: 214 lb 6.4 oz (97.3 kg)  Height: 5' 10 (1.778 m)   Body mass index is 30.76 kg/m.   General: Well developed, well nourished. No acute distress. HEENT: Normocephalic, non-traumatic. PERRL, EOMI. Conjunctiva clear. External ears normal. EAC and TMs normal   bilaterally. Nose clear without congestion or rhinorrhea. No pain on percussion over sinuses. Mucous membranes moist.   Oropharynx clear. Good dentition. Neck: Supple. No lymphadenopathy. No thyromegaly. Lungs: Clear to auscultation bilaterally. No wheezing, rales or rhonchi. Psych: Alert and oriented. Normal mood and affect.  Health Maintenance Due  Topic Date Due   Pneumococcal Vaccine: 50+ Years (1 of 2 - PCV) Never done   Zoster Vaccines- Shingrix (1 of 2) Never done   Influenza Vaccine  12/15/2023   CXR: Normal lungs without acute infiltrates.    Assessment & Plan:   Problem List Items Addressed This Visit       Cardiovascular and Mediastinum   Essential hypertension   Blood pressure is in good control. Continue nebivolol  20 mg daily. May use hydralazine  25 mg daily as needed for BP > 140/90.       Relevant Medications   hydrALAZINE  (APRESOLINE ) 25 MG tablet     Respiratory   Mild persistent asthma - Primary   I will prescribe a tapering dose of prednisone  for the next 10 days. He should continue his use of Symbicort  and Xopenox as needed.      Relevant Medications   budesonide -formoterol  (SYMBICORT ) 160-4.5 MCG/ACT inhaler   levalbuterol  (XOPENEX  HFA) 45 MCG/ACT inhaler    predniSONE  (DELTASONE ) 20 MG tablet   Other Relevant Orders   DG Chest 2 View    Return in about 3 months (around 07/16/2024) for Reassessment.    Daniel Zimmerman Daniel Simpler, MD  I,Emily Lagle,acting as a scribe for Daniel Zimmerman Daniel Simpler, MD.,have documented all relevant documentation on the behalf of Daniel Zimmerman Daniel Simpler, MD.  I, Daniel Zimmerman Daniel Simpler, MD, have reviewed all documentation for this visit. The documentation on 04/17/2024 for the exam, diagnosis, procedures, and orders are all accurate and complete.
